# Patient Record
Sex: Female | Born: 1940 | Race: Black or African American | Hispanic: No | Smoking: Current every day smoker
Health system: Southern US, Community
[De-identification: ages and names within clinical notes are randomized; demographics above are authoritative.]

## PROBLEM LIST (undated history)

## (undated) DIAGNOSIS — E7800 Pure hypercholesterolemia, unspecified: Secondary | ICD-10-CM

## (undated) DIAGNOSIS — I10 Essential (primary) hypertension: Secondary | ICD-10-CM

## (undated) DIAGNOSIS — R252 Cramp and spasm: Secondary | ICD-10-CM

---

## 1997-06-10 ENCOUNTER — Emergency Department (HOSPITAL_COMMUNITY): Admission: EM | Admit: 1997-06-10 | Discharge: 1997-06-10 | Payer: Self-pay | Admitting: Emergency Medicine

## 1997-07-17 ENCOUNTER — Ambulatory Visit (HOSPITAL_COMMUNITY): Admission: RE | Admit: 1997-07-17 | Discharge: 1997-07-17 | Payer: Self-pay | Admitting: Cardiology

## 1997-10-19 ENCOUNTER — Emergency Department (HOSPITAL_COMMUNITY): Admission: EM | Admit: 1997-10-19 | Discharge: 1997-10-19 | Payer: Self-pay | Admitting: Emergency Medicine

## 1999-02-26 ENCOUNTER — Encounter: Payer: Self-pay | Admitting: Cardiology

## 1999-02-26 ENCOUNTER — Ambulatory Visit (HOSPITAL_COMMUNITY): Admission: RE | Admit: 1999-02-26 | Discharge: 1999-02-26 | Payer: Self-pay | Admitting: Cardiology

## 1999-06-04 ENCOUNTER — Inpatient Hospital Stay (HOSPITAL_COMMUNITY): Admission: EM | Admit: 1999-06-04 | Discharge: 1999-06-11 | Payer: Self-pay | Admitting: Emergency Medicine

## 1999-06-04 ENCOUNTER — Encounter: Payer: Self-pay | Admitting: Cardiology

## 1999-06-04 ENCOUNTER — Encounter: Payer: Self-pay | Admitting: Emergency Medicine

## 1999-06-05 ENCOUNTER — Encounter: Payer: Self-pay | Admitting: Cardiology

## 1999-06-14 ENCOUNTER — Emergency Department (HOSPITAL_COMMUNITY): Admission: EM | Admit: 1999-06-14 | Discharge: 1999-06-14 | Payer: Self-pay | Admitting: Emergency Medicine

## 1999-06-16 ENCOUNTER — Inpatient Hospital Stay (HOSPITAL_COMMUNITY): Admission: EM | Admit: 1999-06-16 | Discharge: 1999-06-18 | Payer: Self-pay

## 1999-06-16 ENCOUNTER — Encounter: Payer: Self-pay | Admitting: Cardiovascular Disease

## 1999-07-02 ENCOUNTER — Emergency Department (HOSPITAL_COMMUNITY): Admission: EM | Admit: 1999-07-02 | Discharge: 1999-07-02 | Payer: Self-pay | Admitting: Emergency Medicine

## 2000-01-27 ENCOUNTER — Emergency Department (HOSPITAL_COMMUNITY): Admission: EM | Admit: 2000-01-27 | Discharge: 2000-01-27 | Payer: Self-pay | Admitting: Emergency Medicine

## 2000-08-01 ENCOUNTER — Encounter: Payer: Self-pay | Admitting: Cardiology

## 2000-08-01 ENCOUNTER — Encounter: Admission: RE | Admit: 2000-08-01 | Discharge: 2000-08-01 | Payer: Self-pay | Admitting: Cardiology

## 2001-02-25 ENCOUNTER — Ambulatory Visit (HOSPITAL_COMMUNITY): Admission: RE | Admit: 2001-02-25 | Discharge: 2001-02-25 | Payer: Self-pay | Admitting: Cardiology

## 2002-01-17 ENCOUNTER — Encounter: Payer: Self-pay | Admitting: Emergency Medicine

## 2002-01-17 ENCOUNTER — Emergency Department (HOSPITAL_COMMUNITY): Admission: EM | Admit: 2002-01-17 | Discharge: 2002-01-17 | Payer: Self-pay | Admitting: Emergency Medicine

## 2002-07-26 ENCOUNTER — Emergency Department (HOSPITAL_COMMUNITY): Admission: EM | Admit: 2002-07-26 | Discharge: 2002-07-26 | Payer: Self-pay

## 2002-07-28 ENCOUNTER — Encounter: Payer: Self-pay | Admitting: Cardiology

## 2002-07-28 ENCOUNTER — Inpatient Hospital Stay (HOSPITAL_COMMUNITY): Admission: EM | Admit: 2002-07-28 | Discharge: 2002-07-31 | Payer: Self-pay | Admitting: Emergency Medicine

## 2003-09-05 ENCOUNTER — Emergency Department (HOSPITAL_COMMUNITY): Admission: EM | Admit: 2003-09-05 | Discharge: 2003-09-05 | Payer: Self-pay | Admitting: Emergency Medicine

## 2005-05-13 ENCOUNTER — Emergency Department (HOSPITAL_COMMUNITY): Admission: EM | Admit: 2005-05-13 | Discharge: 2005-05-13 | Payer: Self-pay | Admitting: Family Medicine

## 2005-08-08 ENCOUNTER — Emergency Department (HOSPITAL_COMMUNITY): Admission: EM | Admit: 2005-08-08 | Discharge: 2005-08-08 | Payer: Self-pay | Admitting: Family Medicine

## 2005-08-31 ENCOUNTER — Emergency Department (HOSPITAL_COMMUNITY): Admission: EM | Admit: 2005-08-31 | Discharge: 2005-08-31 | Payer: Self-pay | Admitting: Family Medicine

## 2006-01-19 ENCOUNTER — Emergency Department (HOSPITAL_COMMUNITY): Admission: EM | Admit: 2006-01-19 | Discharge: 2006-01-19 | Payer: Self-pay | Admitting: Family Medicine

## 2006-12-07 ENCOUNTER — Encounter: Admission: RE | Admit: 2006-12-07 | Discharge: 2006-12-07 | Payer: Self-pay | Admitting: Cardiology

## 2006-12-13 ENCOUNTER — Emergency Department (HOSPITAL_COMMUNITY): Admission: EM | Admit: 2006-12-13 | Discharge: 2006-12-13 | Payer: Self-pay | Admitting: Family Medicine

## 2007-01-18 ENCOUNTER — Emergency Department (HOSPITAL_COMMUNITY): Admission: EM | Admit: 2007-01-18 | Discharge: 2007-01-18 | Payer: Self-pay | Admitting: Family Medicine

## 2007-02-01 ENCOUNTER — Encounter: Admission: RE | Admit: 2007-02-01 | Discharge: 2007-02-01 | Payer: Self-pay | Admitting: Cardiology

## 2007-06-05 ENCOUNTER — Emergency Department (HOSPITAL_COMMUNITY): Admission: EM | Admit: 2007-06-05 | Discharge: 2007-06-06 | Payer: Self-pay | Admitting: Emergency Medicine

## 2007-06-12 ENCOUNTER — Emergency Department (HOSPITAL_COMMUNITY): Admission: EM | Admit: 2007-06-12 | Discharge: 2007-06-12 | Payer: Self-pay | Admitting: Family Medicine

## 2008-01-02 ENCOUNTER — Emergency Department (HOSPITAL_COMMUNITY): Admission: EM | Admit: 2008-01-02 | Discharge: 2008-01-02 | Payer: Self-pay | Admitting: Family Medicine

## 2008-02-16 ENCOUNTER — Emergency Department (HOSPITAL_COMMUNITY): Admission: EM | Admit: 2008-02-16 | Discharge: 2008-02-16 | Payer: Self-pay | Admitting: Emergency Medicine

## 2008-04-18 ENCOUNTER — Encounter: Admission: RE | Admit: 2008-04-18 | Discharge: 2008-04-18 | Payer: Self-pay | Admitting: Cardiology

## 2008-08-11 ENCOUNTER — Emergency Department (HOSPITAL_COMMUNITY): Admission: EM | Admit: 2008-08-11 | Discharge: 2008-08-11 | Payer: Self-pay | Admitting: Emergency Medicine

## 2008-11-14 ENCOUNTER — Encounter: Admission: RE | Admit: 2008-11-14 | Discharge: 2008-11-14 | Payer: Self-pay | Admitting: Cardiology

## 2009-05-31 ENCOUNTER — Encounter: Admission: RE | Admit: 2009-05-31 | Discharge: 2009-05-31 | Payer: Self-pay | Admitting: Cardiology

## 2009-06-21 IMAGING — CR DG CHEST 1V PORT
1 series · 1 of 1 positions shown · non-contrast
Comparison: None

CLINICAL DATA: S O B

PORTABLE CHEST - 1 VIEW

[AP]
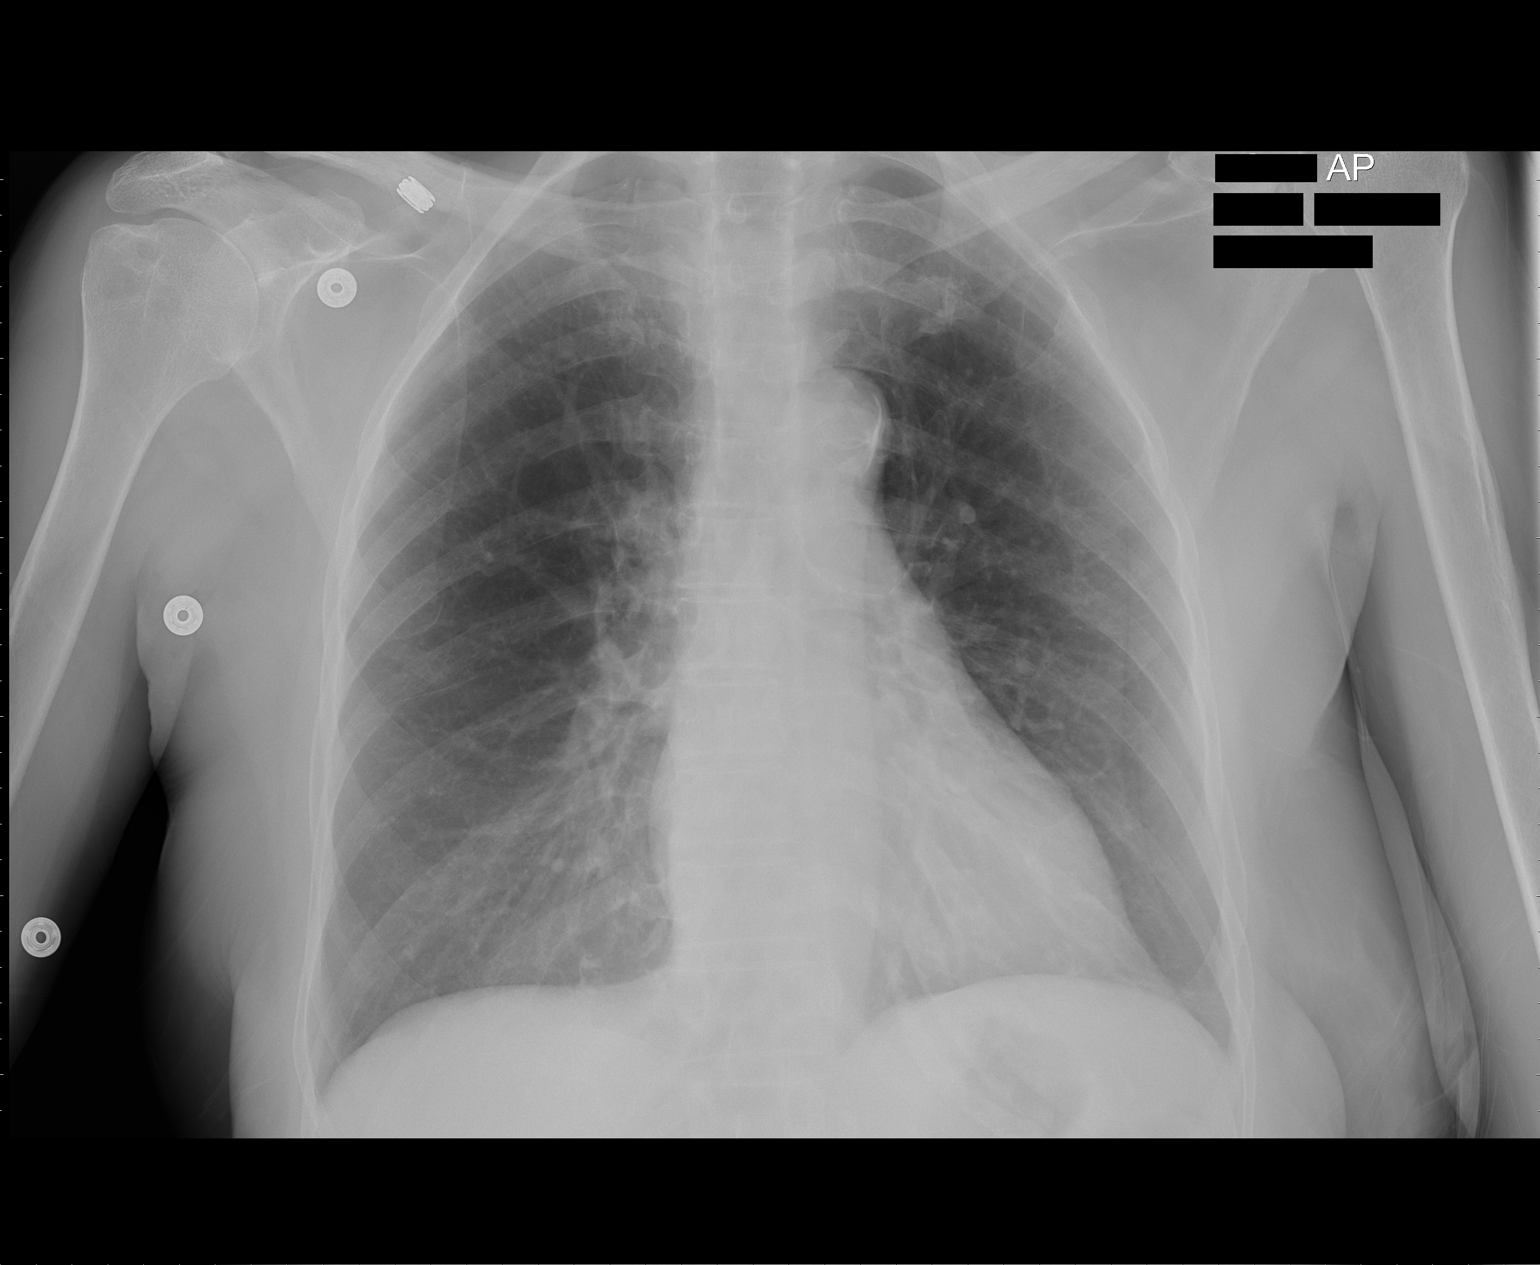

[1 of 1 positions shown; findings below may reference images not displayed]

FINDINGS: Heart size normal.

No pleural effusion or pulmonary edema

No airspace opacities are identified.

There is mildly coarsened interstitial lung markings.
IMPRESSION: 1.  No acute cardiopulmonary abnormalities.

## 2009-08-21 ENCOUNTER — Ambulatory Visit: Payer: Self-pay | Admitting: Surgery

## 2009-08-21 ENCOUNTER — Ambulatory Visit (HOSPITAL_COMMUNITY): Admission: RE | Admit: 2009-08-21 | Discharge: 2009-08-21 | Payer: Self-pay | Admitting: Cardiology

## 2009-08-21 ENCOUNTER — Encounter (INDEPENDENT_AMBULATORY_CARE_PROVIDER_SITE_OTHER): Payer: Self-pay | Admitting: Cardiology

## 2010-02-10 ENCOUNTER — Encounter: Payer: Self-pay | Admitting: Cardiology

## 2010-02-10 ENCOUNTER — Encounter: Payer: Self-pay | Admitting: Gastroenterology

## 2010-05-06 LAB — POCT I-STAT, CHEM 8
Calcium, Ion: 1.2 mmol/L (ref 1.12–1.32)
Creatinine, Ser: 0.8 mg/dL (ref 0.4–1.2)
Glucose, Bld: 187 mg/dL — ABNORMAL HIGH (ref 70–99)
HCT: 39 % (ref 36.0–46.0)
Hemoglobin: 13.3 g/dL (ref 12.0–15.0)
TCO2: 29 mmol/L (ref 0–100)

## 2010-06-07 NOTE — Discharge Summary (Signed)
[place]. [facility]  Patient:    Lisa Carpenter, Lisa Carpenter                    MRN: [id number] Adm. Date:  [id number] Disc. Date: [id number] Attending:  Ricki Rodriguez                           Discharge Summary  REFERRING PHYSICIAN:  Dr. Donia Guiles.  PRINCIPAL DIAGNOSES: 1. Diabetes mellitus type 2. 2. Nausea and vomiting with viral gastroenteritis. 3. Hypertension.  DISCHARGE MEDICATIONS: 1. Norvasc 5 mg 1 p.o. daily. 2. Humulin 70/30 15 units in the morning and 15 units in the evening    subcutaneously.  DIET:  Low fat, low salt diet as tolerated.  ACTIVITY:  As tolerated.  FOLLOW-UP:  Follow-up appointment by Dr. Donia Guiles in two weeks.  Patient to call the office.  SPECIAL INSTRUCTIONS:  Patient to check blood sugars daily.  CONDITION ON DISCHARGE:  Stable.  HISTORY OF PRESENT ILLNESS:  This 70 year old black female presented with elevated blood sugars and nausea and vomiting.  The patient had some abdominal discomfort.  Has a chronic history of pancreatitis and a history of diabetes for 18 years.  The patient recently quit smoking.  PHYSICAL EXAMINATION:  VITAL SIGNS:  Temperature 100, pulse 93, respirations 16, blood pressure 177/88.  Height 5 feet 7 inches, weight 138 pounds.  GENERAL:  Alert and oriented x 3.  HEENT:  Normocephalic, atraumatic.  Eyes:  Manson Passey, with pupils equally reacting to light.  Extraocular movements intact.  Conjunctivae pink, sclerae nonicteric.  Ears, nose, throat:  Mucous membranes pink and moist.  NECK:  No JVD.  No carotid bruit.  LUNGS:  Clear bilaterally.  HEART:  Normal S1, S2.  ABDOMEN:  Soft, with mild epigastric tenderness.  EXTREMITIES:  No edema, cyanosis, or clubbing.  CNS:  Cranial nerves II-XII grossly intact.  LABORATORY DATA:  Near normal hemoglobin and hematocrit, borderline WBC count, normal platelet count.  Sodium falsely low at 124, potassium 4.9, BUN 8, creatinine 1.1, glucose  elevated at 416.  Urinalysis negative for bacteria. Serum amylase and lipase were normal.  X-ray of the abdomen revealed mild degenerative changes of the spine and normal bowel gas pattern with the possibility of chronic pancreatitis.  HOSPITAL COURSE:  The patient was admitted to [address], started on IV fluids, and she had Humulin 70/30 a.m. dose plus Regular Insulin coverage. Her x-ray of the abdomen did not show any obstruction; however, her elevated blood glucoses were controlled with clonidine and Norvasc use.  Subsequently, with low blood pressure, clonidine was withdrawn.  Her abdominal discomfort disappeared.  She did not have any vomiting or diarrhea, and her blood sugars were controlled nicely with restarting insulin usual doses, and she was discharged home in satisfactory condition, with follow-up by me in two weeks or by Dr. Donia Guiles in two weeks. DD:  06/18/99 TD:  06/18/99 Job: [id number]

## 2010-06-07 NOTE — H&P (Signed)
[place]. [facility]  Patient:    Lisa Carpenter, Lisa Carpenter                      MRN: [id number] Attending:  Sabino Gasser, M.D. CC:         Osvaldo Shipper. Spruill, M.D.             Anselmo Rod, M.D.                         History and Physical  HISTORY OF PRESENT ILLNESS:  Lisa Carpenter is a 70 year old black female who was admitted with abdominal pain in the right upper quadrant associated with vomiting and a loose stool.  The patient states that she was well until this past Monday, at which point she had eaten dinner, went to bed, and awoke about 1 a.m., at which point she was awakened with severe right upper quadrant pain that doubled her over.  Subsequently, she developed vomiting of clear material.  There was no food vomited.  She had eaten approximately eight hours before, [place], greens, and a full meal.  She states that the pain lasted throughout the night but dissipated and went away as she got to the hospital. Vomiting persisted, but it too has now abated, and the last time she vomited was yesterday.  She did feel nauseated since then but subsequently took broth and clear liquid diet for breakfast and lunch but then at a salad at supper with steak and other things on the menu and tolerated those well without vomiting or abdominal pain recurring.  The patient states that she has had two or three episodes similar to this in the past, which sound like she has been told they might be chronic pancreatitis.  She associates these with the passage of oily, foul-smelling stools, which she had one of which a couple of days ago.  She denies fever, chills, night sweats, hematemesis, dysphagia, weight loss, change in bowel habits, blood in her stools.  She said that she sounds like she had colonoscopy and possibly an endoscopy done by Dr. Loreta Ave, which she said was in the recent past, for evaluation of his, and she, it sounds, received pancreatic supplements to be taken at  supper time, but this did not seem to help her situation according to her.  She says her stools have been light in color recently, as they typically are.  She denies black stools.  PAST MEDICAL HISTORY:  Also notable for chronic back problems and diabetes since 1983, recent onset of hypertension, and she has a disability because of her medical problems.  SOCIAL HISTORY:  She smokes a couple of cigarettes a day.  Denies alcohol at this time, although she does admit to drinking in the past.  FAMILY HISTORY:  Negative for colon polyps, colon cancers, or other GI disorders.  She had one son, who is now deceased.  ALLERGIES:  She denies allergies to medications.  PHYSICAL EXAMINATION:  GENERAL:  She is an alert female, who appears in no distress, oriented to time, place, and person.  HEENT:  Unremarkable.  NECK:  Thyroid not enlarged.  No bruits are heard in the neck.  LUNGS:  Clear.  HEART:  Regular rhythm without murmurs, gallops, rubs.  ABDOMEN:  Benign, nontender, bowel sounds are active.  RECTAL:  Deferred.  LABORATORY DATA:  Hemoglobin A1C is 9.6.  Lipid profile shows triglycerides of 64.  Amylase is 27.  Electrolytes  unremarkable.  BUN is now 11, creatinine 0.9, calcium 9.1.  Her osmolality was 305, and she was somewhat dehydrated on admission with liver function tests showing a total bilirubin of 1.6, but otherwise liver function tests were unremarkable.  CBC on admission showed a normal white count.  Radiology studies showed an abdominal ultrasound showing the liver to be unremarkable, no abnormality of the gallbladder, bile duct, head of pancreas well seen and unremarkable.  Tail is not well seen of the pancreas.  IMPRESSION:  Recurrence of chronic pancreatitis, resolving or almost totally resolved at this point.  Wound continue diet as tolerated and follow.  If she tolerates diet, could probably be discharged home soon. DD:  06/06/99 TD:  06/07/99 Job:  [id number]

## 2010-06-07 NOTE — Discharge Summary (Signed)
NAME:  Lisa Carpenter, Lisa Carpenter                       ACCOUNT NO.:  [id number]   MEDICAL RECORD NO.:  [id number]                   PATIENT TYPE:  INP   LOCATION:  0372                                 FACILITY:  [facility]   PHYSICIAN:  Mohan N. Sharyn Lull, M.D.              DATE OF BIRTH:  08-Nov-1940   DATE OF ADMISSION:  07/28/2002  DATE OF DISCHARGE:  07/31/2002                                 DISCHARGE SUMMARY   ADMISSION DIAGNOSES:  1. Uncontrolled diabetes mellitus.  2. Abdominal pain rule out  pancreatitis.   FINAL DIAGNOSES:  1. Status post uncontrolled diabetes mellitus with gastroparesis.  2. Hypertension.  3. Status post abdominal pain rule out gastroesophageal reflux disease, rule     out  peptic ulcer disease.   DISCHARGE MEDICATIONS:  1. Toprol XL 50 mg 1 tablet daily.  2. Glucophage SR 400 mg 1 tablet daily with supper.  3. Avandia 2 mg 1 tablet daily in the morning.  4. Lantus insulin 10 units SQ daily at night.  5. Protonix 40 mg 1 tablet daily.  6. Reglan 10 mg 1 tablet before meals three times a day.   ACTIVITY:  As tolerated.   DIET:  Low-salt, low-cholesterol, 1800 calorie ADA diet.   SPECIAL INSTRUCTIONS:  The patient has been advised to monitor her blood  sugar twice daily and chart.   FOLLOW UP:  With Dr. Algie Coffer in 1 week and Dr. Shana Chute in 2 weeks.   CONDITION ON DISCHARGE:  Stable.   HISTORY OF PRESENT ILLNESS:  Lisa Carpenter is a 70 year old black female with  past medical history significant for insulin requiring diabetes mellitus,  hypertension, history of pancreatitis, history of tobacco abuse and alcohol  abuse. Questionable history of hypercholesterolemia. Was admitted by Dr.  Algie Coffer on July 28, 2002 because of abdominal pain, nausea, and feeling weak  for 2 days.   PAST MEDICAL HISTORY:  As above.   HOME MEDICATIONS:  She takes Glucophage, Avandia and Lantus insulin.   ALLERGIES:  None.   SOCIAL HISTORY:  She is divorced, has 1 son. One son died  of renal failure  at the age of 42. She is retired. __________ in the past.   FAMILY HISTORY:  Her father died of MI in his 63's. Has 2 brothers and 3  sisters. Mother is alive. She is 82.   PHYSICAL EXAMINATION:  GENERAL: She was alert, awake, oriented x3. No acute  distress.  HEENT: Conjunctiva was pink.  NECK: No JVD.  LUNGS: Clear.  HEART: Sounds S1 and S2 are normal.  EXTREMITIES: There is no clubbing, cyanosis, or edema.  NEUROLOGIC: Grossly intact.   LABORATORY DATA:  Urinalysis was positive for ketones and glucose. Her  sodium was 131, potassium was 4.3, blood sugar was 512, chloride 96, bicarb  24, BUN 21, creatinine 1.2. Her liver enzymes are normal. Hemoglobin A1C was  11.4. Hemoglobin was 12.9. Hematocrit 38.8. White count 6.8.  Repeat blood  sugar on July 29, 2002 was 2.16. Her amylase was 25, lipase was 10. Her blood  sugar this morning 75. CBG is 91. BUN is 10. Creatinine 0.9. Potassium is  4.1.  Her KUB was negative.   HOSPITAL COURSE:  The patient was admitted to telemetry. Was started on IV  fluids and IV insulin with good control of blood sugar. The patient's blood  sugar is fairly well controlled. The patient did not have any further  episodes of abdominal pain, nausea, vomiting or diarrhea. The patient is  ambulating in the hallway without any problems. Her appetite has improved  since admission.   DISPOSITION:  The patient will be discharged home on above medications and  will be followed by Dr. Algie Coffer in 1 week and Dr. Shana Chute in 2 weeks.                                               Eduardo Osier. Sharyn Lull, M.D.    MNH/MEDQ  D:  07/31/2002  T:  07/31/2002  Job:  [id number]   cc:   Ricki Rodriguez, M.D.  [address]  [place] [address]   Osvaldo Shipper. Spruill, M.D.  [address] [address]  [place]  [place] [address]  Fax: [phone]

## 2010-06-07 NOTE — Discharge Summary (Signed)
[place] [facility]  Patient:    Lisa Carpenter, Lisa Carpenter                    MRN: [id number] Adm. Date:  [id number] Disc. Date: [id number] Attending:  Doug Sou Dictator:   Lyla Son. Achilles Dunk.                           Discharge Summary  DISCHARGE DIAGNOSES: 1. Chronic pancreatitis. 2. Insulin-dependent diabetes mellitus with neuropathy. 3. Diabetic ketoacidosis. 4. Gastroparesis.  HISTORY OF PRESENT ILLNESS AND HOSPITAL COURSE:  This is a 69 year old patient who was admitted to the [facility] on Jun 04, 1999, after having a two-day complaint of nausea, vomiting, poor appetite.  She was evaluated in the emergency department.  There she was noted to have an electrocardiogram that showed no acute changes, a chest x-ray that was within normal limits, a complete blood count that was within normal limits.  The chemistries, however, showed a ______ of 133, a CO2 of 23, ______ of 369.  Serum acetone was small.  At this point the patient was admitted for diabetic ketoacidosis.  The patient was started on aggressive IV fluid therapy.  The patient was treated with sliding scale insulin.  Treated with Altace for her elevated blood pressure of 197/114.  The patient was also noted to have diabetic gastroparesis.  Consultation was obtained from GI medicine, Dr. Elsie Amis.  It was the GI opinion that the patient had recurrent episode of chronic pancreatitis.  On Jun 07, 1999, the patient was seen by the diabetic coordinator, and patient education on her diabetic issues was carried out.  On Jun 10, 1999, the glucose was elevated at 503.  The patients insulins were adjusted.  The patient responded nicely.  On Jun 11, 1999, it was the opinion that the patient was now able to keep liquids and keep foods down.  She was feeling some better.  The discomfort was improved.  The glucose was under better control, and it was the opinion that she could be discharged  home, having received maximum benefit from this hospitalization.  DISCHARGE MEDICATIONS: 1. Humulin 70/30 insulin 15 units q.a.m., 15 units q.p.m. 2. Reglan 10 mg 2 tablets 1/2 hour before meals and at bedtime. 3. Phenergan tablets 50 mg every six hours as needed.  DISCHARGE INSTRUCTIONS:  The patient is advised to monitor her sugar carefully and keep a record.  She is to notify the physician if sugars are greater than 350 or less than 80.  FOLLOW-UP:  She is to be seen in the office in two weeks for general follow-up and sooner if any changes, problems, or concerns. DD:  07/16/99 TD:  07/16/99 Job: [id number]

## 2010-08-13 ENCOUNTER — Ambulatory Visit (INDEPENDENT_AMBULATORY_CARE_PROVIDER_SITE_OTHER): Payer: Medicare HMO

## 2010-08-13 ENCOUNTER — Inpatient Hospital Stay (INDEPENDENT_AMBULATORY_CARE_PROVIDER_SITE_OTHER)
Admission: RE | Admit: 2010-08-13 | Discharge: 2010-08-13 | Disposition: A | Discharge status: Home / Self Care | Payer: Medicare HMO | Source: Ambulatory Visit | Attending: Emergency Medicine | Admitting: Emergency Medicine

## 2010-08-13 DIAGNOSIS — S5000XA Contusion of unspecified elbow, initial encounter: Secondary | ICD-10-CM

## 2010-08-13 DIAGNOSIS — I10 Essential (primary) hypertension: Secondary | ICD-10-CM

## 2010-10-16 LAB — GLUCOSE, RANDOM: Glucose, Bld: 92

## 2010-10-16 LAB — POCT I-STAT, CHEM 8
Calcium, Ion: 1.1 — ABNORMAL LOW
Creatinine, Ser: 1.1
Hemoglobin: 13.3
Sodium: 124 — ABNORMAL LOW
TCO2: 24

## 2010-10-16 LAB — POTASSIUM: Potassium: 3.5

## 2010-10-16 LAB — URINALYSIS, ROUTINE W REFLEX MICROSCOPIC

## 2010-10-16 LAB — URINE MICROSCOPIC-ADD ON

## 2010-10-25 LAB — CBC
HCT: 35.9 % — ABNORMAL LOW (ref 36.0–46.0)
MCHC: 33.4 g/dL (ref 30.0–36.0)
Platelets: 162 10*3/uL (ref 150–400)
RDW: 13.2 % (ref 11.5–15.5)

## 2010-10-25 LAB — DIFFERENTIAL
Basophils Relative: 1 % (ref 0–1)
Lymphocytes Relative: 32 % (ref 12–46)
Lymphs Abs: 1.3 10*3/uL (ref 0.7–4.0)
Monocytes Absolute: 0.5 10*3/uL (ref 0.1–1.0)
Monocytes Relative: 12 % (ref 3–12)
Neutro Abs: 2.2 10*3/uL (ref 1.7–7.7)

## 2010-10-25 LAB — COMPREHENSIVE METABOLIC PANEL
Albumin: 3.5 g/dL (ref 3.5–5.2)
Alkaline Phosphatase: 89 U/L (ref 39–117)
BUN: 8 mg/dL (ref 6–23)
Calcium: 8.4 mg/dL (ref 8.4–10.5)
Potassium: 4.3 mEq/L (ref 3.5–5.1)
Total Protein: 6.2 g/dL (ref 6.0–8.3)

## 2011-03-03 ENCOUNTER — Encounter (HOSPITAL_COMMUNITY): Payer: Self-pay | Admitting: *Deleted

## 2011-03-03 ENCOUNTER — Emergency Department (INDEPENDENT_AMBULATORY_CARE_PROVIDER_SITE_OTHER)
Admission: EM | Admit: 2011-03-03 | Discharge: 2011-03-03 | Disposition: A | Discharge status: Home / Self Care | Payer: Medicare Other | Source: Home / Self Care | Attending: Emergency Medicine | Admitting: Emergency Medicine

## 2011-03-03 DIAGNOSIS — B029 Zoster without complications: Secondary | ICD-10-CM

## 2011-03-03 HISTORY — DX: Essential (primary) hypertension: I10

## 2011-03-03 HISTORY — DX: Pure hypercholesterolemia, unspecified: E78.00

## 2011-03-03 HISTORY — DX: Cramp and spasm: R25.2

## 2011-03-03 MED ORDER — TRAMADOL HCL 50 MG PO TABS: 100.0000 mg | ORAL_TABLET | Freq: Three times a day (TID) | ORAL | Status: AC | PRN

## 2011-03-03 MED ORDER — ACYCLOVIR 400 MG PO TABS: 800.0000 mg | ORAL_TABLET | ORAL | Status: AC

## 2011-03-03 NOTE — ED Provider Notes (Signed)
Chief Complaint  Patient presents with  . Neck Pain    History of Present Illness:  The patient has had a three-day history of pain in the left trapezius ridge, radiating to the left ear in the entire left side of the face. It feels like a throbbing pain and is worse with movement of the neck. She denies any radiation the pain down the arm, numbness, tingling, weakness in the arm. There is no pain on movement of the shoulder. She denies any headache, recent changes in her vision, or jaw claudication. She denies any facial paresthesias, weakness, difficulty with speech, or any other neurological symptoms. She is a diabetic which is currently controlled with medication.  Review of Systems:  Other than noted above, the patient denies any of the following symptoms: Constitutional:  No fever, chills, or sweats. ENT:  No nasal congestion, sore throat, pain on swallowing, or oral ulcerations or lesions.  No hoarseness or change in voice. Neck:  No swelling, or adenopathy.  Full ROM without pain. Cardiac:  No chest pain, tightness, pressure, or syncope. Respiratory:  No cough, wheezing, or dyspnea. M-S:  No joint pain, muscle pain, or back problems. Neuro:  No muscle weakness, numbness or paresthesias. Skin:  No rash.  PMFSH:  Past medical history, family history, social history, meds, and allergies were reviewed.  Physical Exam:   Vital signs:  BP 169/75  Pulse 83  Temp(Src) 97.2 F (36.2 C) (Oral)  Resp 20  SpO2 98% General:  Alert, oriented and in no distress. Eye:  PERRL, full EOMs. ENT:  Pharynx clear, no oral lesions. Neck:  There is pain to palpation over the trapezius ridge, but no pain to palpation over the midline neck or over the posterior neck. There is also tenderness to palpation over the anterior neck in the anterior cervical triangle, but no mass or adenopathy was felt. The neck has limited range of motion with pain on movement. Lungs:  No respiratory distress.  Breath sounds  clear and equal bilaterally.  No wheezes, rales or rhonchi. Heart:  Regular rhythm.  No gallops, murmers, or rubs. Ext:  No upper extremity edema, pulses full.  Full ROM of joints with no joint or muscle pain to palpation. Neuro:  Alert and oriented times 3.  No focal muscle weakness.  DTRs symmetric.  Sensation intact to light touch. Skin: Clear, warm and dry.  No rash.  Good capillary refill.  Labs:  A sedimentation rate was drawn.   Assessment:   Diagnoses that have been ruled out:  None  Diagnoses that are still under consideration:  Cervical strain, temporal arteritis, osteoarthritis of neck, or cervical radiculopathy or neuritis   None  Final diagnoses:  Shingles    Plan:   1.  The following meds were prescribed:   New Prescriptions   ACYCLOVIR (ZOVIRAX) 400 MG TABLET    Take 2 tablets (800 mg total) by mouth every 4 (four) hours while awake.   TRAMADOL (ULTRAM) 50 MG TABLET    Take 2 tablets (100 mg total) by mouth every 8 (eight) hours as needed for pain.   2.  The patient was instructed in symptomatic care and handouts were given. 3.  The patient was told to return if becoming worse in any way, if no better in 3 or 4 days, and given some red flag symptoms that would indicate earlier return. I told the patient to followup with her primary care physician, Dr. Sharyn Lull in 2 days.    Onalee Hua  Matthew Folks, MD 03/03/11 [phone]

## 2011-03-03 NOTE — ED Notes (Signed)
Pt is here with complaints of 2 day history of left sided neck pain and stiffness.  Describes pain as sore muscles.  Pt states she woke up with pain one morning, no known injury.

## 2011-03-04 ENCOUNTER — Telehealth (HOSPITAL_COMMUNITY): Payer: Self-pay | Admitting: *Deleted

## 2011-03-04 NOTE — ED Notes (Signed)
1725  Sed Rate 40 H. Lab shown to Dr. Lorenz Coaster.  He said to call pt. and tell her is was a " little bit high- not serious- no further treatment needed-f/u with your PCP." I called pt. and verified her DOB. Pt. given result and doctors instructions. Vassie Moselle 03/04/2011

## 2011-05-27 ENCOUNTER — Emergency Department (HOSPITAL_COMMUNITY)
Admission: EM | Admit: 2011-05-27 | Discharge: 2011-05-27 | Disposition: A | Discharge status: Home / Self Care | Payer: Medicare Other | Attending: Emergency Medicine | Admitting: Emergency Medicine

## 2011-05-27 ENCOUNTER — Encounter (HOSPITAL_COMMUNITY): Payer: Self-pay | Admitting: *Deleted

## 2011-05-27 DIAGNOSIS — M7989 Other specified soft tissue disorders: Secondary | ICD-10-CM

## 2011-05-27 DIAGNOSIS — F17200 Nicotine dependence, unspecified, uncomplicated: Secondary | ICD-10-CM | POA: Insufficient documentation

## 2011-05-27 DIAGNOSIS — M79662 Pain in left lower leg: Secondary | ICD-10-CM

## 2011-05-27 DIAGNOSIS — R209 Unspecified disturbances of skin sensation: Secondary | ICD-10-CM | POA: Insufficient documentation

## 2011-05-27 DIAGNOSIS — I10 Essential (primary) hypertension: Secondary | ICD-10-CM | POA: Insufficient documentation

## 2011-05-27 DIAGNOSIS — M79609 Pain in unspecified limb: Secondary | ICD-10-CM

## 2011-05-27 DIAGNOSIS — E119 Type 2 diabetes mellitus without complications: Secondary | ICD-10-CM | POA: Insufficient documentation

## 2011-05-27 DIAGNOSIS — Z794 Long term (current) use of insulin: Secondary | ICD-10-CM | POA: Insufficient documentation

## 2011-05-27 MED ORDER — OXYCODONE-ACETAMINOPHEN 5-325 MG PO TABS
1.0000 | ORAL_TABLET | Freq: Once | ORAL | Status: AC
  Administered 2011-05-27: 1 via ORAL
  Filled 2011-05-27 (×2): qty 1

## 2011-05-27 MED ORDER — OXYCODONE-ACETAMINOPHEN 5-325 MG PO TABS: 1.0000 | ORAL_TABLET | ORAL | Status: AC | PRN

## 2011-05-27 NOTE — ED Notes (Signed)
Pt is here with swelling to left leg and was put on coumadin by dr.harwani just incase she has a blood clot.  Pt is having pain in calf and has some redness.  Pulse palpable

## 2011-05-27 NOTE — ED Provider Notes (Signed)
History  This chart was scribed for Lisa Booze, MD by Bennett Scrape. This patient was seen in room STRE8/STRE8 and the patient's care was started at 12:49PM.  CSN: [id number]  Arrival date & time 05/27/11  1026   First MD Initiated Contact with Patient 05/27/11 1249      Chief Complaint  Patient presents with  . Leg Swelling    left     The history is provided by the patient. No language interpreter was used.    Lisa Carpenter is a 71 y.o. female who presents to the Emergency Department complaining of one month of gradual onset, gradually worsening, constant pain in left calf described as sharp with shooting pains into her left toes with associated numbness. She is ambulatory but states that the pain is worse with walking. She states that the pain is worse at night because it starts cramping. She states she was given ASA and pain medications by PCP with no improvement in symptoms. Pt also reports that she has been using Aleve to treat symptoms. She states that she was put on plavix by her PCP just in case she has a blood clot. She denies pain in the left leg, nausea and SOB. She has a h/o HTN and diabetes.  Dr. Sharyn Lull is PCP.  Past Medical History  Diagnosis Date  . Hypertension   . Diabetes mellitus   . High cholesterol   . Muscle cramps at night     History reviewed. No pertinent past surgical history.  No family history on file.  History  Substance Use Topics  . Smoking status: Current Everyday Smoker- one cigarette a day  . Smokeless tobacco: Not on file  . Alcohol Use: No     Review of Systems  Constitutional: Negative for fever and chills.  Respiratory: Negative for shortness of breath.   Cardiovascular: Positive for leg swelling. Negative for chest pain.  Gastrointestinal: Negative for nausea and vomiting.  Neurological: Negative for weakness.    Allergies  Review of patient's allergies indicates no known allergies.  Home Medications   Current  Outpatient Rx  Name Route Sig Dispense Refill  . AMLODIPINE BESYLATE 5 MG PO TABS Oral Take 5 mg by mouth daily.    . ASPIRIN EC 81 MG PO TBEC Oral Take 81 mg by mouth daily.    Marland Kitchen CLOPIDOGREL BISULFATE 75 MG PO TABS Oral Take 75 mg by mouth daily.    Marland Kitchen EZETIMIBE-SIMVASTATIN 10-20 MG PO TABS Oral Take 1 tablet by mouth at bedtime.    . INSULIN GLARGINE 100 UNIT/ML [place] SOLN Subcutaneous Inject 20 Units into the skin daily.     . TRAMADOL HCL 50 MG PO TABS Oral Take 50 mg by mouth 3 (three) times daily.      Triage Vitals: BP 187/68  Pulse 79  Temp(Src) 98 F (36.7 C) (Oral)  Resp 18  SpO2 96%  Physical Exam  Nursing note and vitals reviewed. Constitutional: She is oriented to person, place, and time. She appears well-developed and well-nourished. No distress.  HENT:  Head: Normocephalic and atraumatic.  Eyes: EOM are normal.  Neck: Neck supple. No tracheal deviation present.  Cardiovascular: Normal rate.   Pulmonary/Chest: Effort normal. No respiratory distress.  Musculoskeletal: Normal range of motion. She exhibits tenderness.       Calf circumference is equal, moderate tenderness to palpation of the right calf, no cord, no erythema, no warmth, negative holman's sign, strong dorsalis pedis pulses, prompt capillary refill  Neurological: She  is alert and oriented to person, place, and time.  Skin: Skin is warm and dry.  Psychiatric: She has a normal mood and affect. Her behavior is normal.    ED Course  Procedures (including critical care time)  DIAGNOSTIC STUDIES: Oxygen Saturation is 96% on room air, adequate by my interpretation.    COORDINATION OF CARE: 1:07PM-Advised pt to not use Aleve while on blood thinners due to bleeding complications. Discussed blood work, pain medications and [place] with pt and pt agreed to plan. 2:50PM-Discussed negative [place] with pt and pt acknowledged negative results. Discussed percocet with pt and pt agreed. Advised pt to follow up with PCP for further  evaluation.  Venous Doppler is reported to be negative for DVT. She'll be given a prescription for Percocet for pain and referred back to her PCP.  1. Pain of left calf       MDM  Calf pain of uncertain cause. No clinical sign of DVT but will get venous Doppler.  I personally performed the services described in this documentation, which was scribed in my presence. The recorded information has been reviewed and considered.         Lisa Booze, MD 05/30/11 [phone]

## 2011-05-27 NOTE — Progress Notes (Signed)
Bilateral lower extremity venous duplex completed.  Preliminary report is negative for DVT, SVT, or a Baker's cyst. 

## 2011-05-27 NOTE — Discharge Instructions (Signed)
Your Doppler test did not show any signs of blood clots. Take the pain medication as needed. Make a followup appointment with your doctor.   Pain of Unknown Etiology (Pain Without a Known Cause) You have come to your caregiver because of pain. Pain can occur in any part of the body. Often there is not a definite cause. If your laboratory (blood or urine) work was normal and x-rays or other studies were normal, your caregiver may treat you without knowing the cause of the pain. An example of this is the headache. Most headaches are diagnosed by taking a history. This means your caregiver asks you questions about your headaches. Your caregiver determines a treatment based on your answers. Usually testing done for headaches is normal. Often testing is not done unless there is no response to medications. Regardless of where your pain is located today, you can be given medications to make you comfortable. If no physical cause of pain can be found, most cases of pain will gradually leave as suddenly as they came.  If you have a painful condition and no reason can be found for the pain, It is importantthat you follow up with your caregiver. If the pain becomes worse or does not go away, it may be necessary to repeat tests and look further for a possible cause.  Only take over-the-counter or prescription medicines for pain, discomfort, or fever as directed by your caregiver.   For the protection of your privacy, test results can not be given over the phone. Make sure you receive the results of your test. Ask as to how these results are to be obtained if you have not been informed. It is your responsibility to obtain your test results.   You may continue all activities unless the activities cause more pain. When the pain lessens, it is important to gradually resume normal activities. Resume activities by beginning slowly and gradually increasing the intensity and duration of the activities or exercise. During  periods of severe pain, bed-rest may be helpful. Lay or sit in any position that is comfortable.   Ice used for acute (sudden) conditions may be effective. Use a large plastic bag filled with ice and wrapped in a towel. This may provide pain relief.   See your caregiver for continued problems. They can help or refer you for exercises or physical therapy if necessary.  If you were given medications for your condition, do not drive, operate machinery or power tools, or sign legal documents for 24 hours. Do not drink alcohol, take sleeping pills, or take other medications that may interfere with treatment. See your caregiver immediately if you have pain that is becoming worse and not relieved by medications. Document Released: 10/01/2000 Document Revised: 12/26/2010 Document Reviewed: 01/06/2005 [facility] Patient Information 2012 [place], [place].  Acetaminophen; Oxycodone tablets What is this medicine? ACETAMINOPHEN; OXYCODONE (a set a MEE noe fen; ox i KOE done) is a pain reliever. It is used to treat mild to moderate pain. This medicine may be used for other purposes; ask your health care provider or pharmacist if you have questions. What should I tell my health care provider before I take this medicine? They need to know if you have any of these conditions: -brain tumor -Crohn's disease, inflammatory bowel disease, or ulcerative colitis -drink more than 3 alcohol containing drinks per day -drug abuse or addiction -head injury -heart or circulation problems -kidney disease or problems going to the bathroom -liver disease -lung disease, asthma, or breathing problems -  an unusual or allergic reaction to acetaminophen, oxycodone, other opioid analgesics, other medicines, foods, dyes, or preservatives -pregnant or trying to get pregnant -breast-feeding How should I use this medicine? Take this medicine by mouth with a full glass of water. Follow the directions on the prescription label. Take  your medicine at regular intervals. Do not take your medicine more often than directed. Talk to your pediatrician regarding the use of this medicine in children. Special care may be needed. Patients over 19 years old may have a stronger reaction and need a smaller dose. Overdosage: If you think you have taken too much of this medicine contact a poison control center or emergency room at once. NOTE: This medicine is only for you. Do not share this medicine with others. What if I miss a dose? If you miss a dose, take it as soon as you can. If it is almost time for your next dose, take only that dose. Do not take double or extra doses. What may interact with this medicine? -alcohol or medicines that contain alcohol -antihistamines -barbiturates like amobarbital, butalbital, butabarbital, methohexital, pentobarbital, phenobarbital, thiopental, and secobarbital -benztropine -drugs for bladder problems like solifenacin, trospium, oxybutynin, tolterodine, hyoscyamine, and methscopolamine -drugs for breathing problems like ipratropium and tiotropium -drugs for certain stomach or intestine problems like propantheline, homatropine methylbromide, glycopyrrolate, atropine, belladonna, and dicyclomine -general anesthetics like etomidate, ketamine, nitrous oxide, propofol, desflurane, enflurane, halothane, isoflurane, and sevoflurane -medicines for depression, anxiety, or psychotic disturbances -medicines for pain like codeine, morphine, pentazocine, buprenorphine, butorphanol, nalbuphine, tramadol, and propoxyphene -medicines for sleep -muscle relaxants -naltrexone -phenothiazines like perphenazine, thioridazine, chlorpromazine, mesoridazine, fluphenazine, prochlorperazine, promazine, and trifluoperazine -scopolamine -trihexyphenidyl This list may not describe all possible interactions. Give your health care provider a list of all the medicines, herbs, non-prescription drugs, or dietary supplements you  use. Also tell them if you smoke, drink alcohol, or use illegal drugs. Some items may interact with your medicine. What should I watch for while using this medicine? Tell your doctor or health care professional if your pain does not go away, if it gets worse, or if you have new or a different type of pain. You may develop tolerance to the medicine. Tolerance means that you will need a higher dose of the medication for pain relief. Tolerance is normal and is expected if you take this medicine for a long time. Do not suddenly stop taking your medicine because you may develop a severe reaction. Your body becomes used to the medicine. This does NOT mean you are addicted. Addiction is a behavior related to getting and using a drug for a nonmedical reason. If you have pain, you have a medical reason to take pain medicine. Your doctor will tell you how much medicine to take. If your doctor wants you to stop the medicine, the dose will be slowly lowered over time to avoid any side effects. You may get drowsy or dizzy. Do not drive, use machinery, or do anything that needs mental alertness until you know how this medicine affects you. Do not stand or sit up quickly, especially if you are an older patient. This reduces the risk of dizzy or fainting spells. Alcohol may interfere with the effect of this medicine. Avoid alcoholic drinks. The medicine will cause constipation. Try to have a bowel movement at least every 2 to 3 days. If you do not have a bowel movement for 3 days, call your doctor or health care professional. Do not take Tylenol (acetaminophen) or medicines that have  acetaminophen with this medicine. Too much acetaminophen can be very dangerous. Many nonprescription medicines contain acetaminophen. Always read the labels carefully to avoid taking more acetaminophen. What side effects may I notice from receiving this medicine? Side effects that you should report to your doctor or health care professional as  soon as possible: -allergic reactions like skin rash, itching or hives, swelling of the face, lips, or tongue -breathing difficulties, wheezing -confusion -light headedness or fainting spells -severe stomach pain -yellowing of the skin or the whites of the eyes Side effects that usually do not require medical attention (report to your doctor or health care professional if they continue or are bothersome): -dizziness -drowsiness -nausea -vomiting This list may not describe all possible side effects. Call your doctor for medical advice about side effects. You may report side effects to FDA at 1-800-FDA-1088. Where should I keep my medicine? Keep out of the reach of children. This medicine can be abused. Keep your medicine in a safe place to protect it from theft. Do not share this medicine with anyone. Selling or giving away this medicine is dangerous and against the law. Store at room temperature between 20 and 25 degrees C (68 and 77 degrees F). Keep container tightly closed. Protect from light. Flush any unused medicines down the toilet. Do not use the medicine after the expiration date. NOTE: This sheet is a summary. It may not cover all possible information. If you have questions about this medicine, talk to your doctor, pharmacist, or health care provider.  2012, [employer]. (12/06/2007 10:01:21 AM)

## 2011-11-26 ENCOUNTER — Emergency Department (HOSPITAL_COMMUNITY)
Admission: EM | Admit: 2011-11-26 | Discharge: 2011-11-26 | Disposition: A | Discharge status: Home / Self Care | Payer: Medicare Other | Attending: Emergency Medicine | Admitting: Emergency Medicine

## 2011-11-26 ENCOUNTER — Emergency Department (HOSPITAL_COMMUNITY): Payer: Medicare Other

## 2011-11-26 DIAGNOSIS — R739 Hyperglycemia, unspecified: Secondary | ICD-10-CM

## 2011-11-26 DIAGNOSIS — Z794 Long term (current) use of insulin: Secondary | ICD-10-CM | POA: Insufficient documentation

## 2011-11-26 DIAGNOSIS — F17200 Nicotine dependence, unspecified, uncomplicated: Secondary | ICD-10-CM | POA: Insufficient documentation

## 2011-11-26 DIAGNOSIS — E1169 Type 2 diabetes mellitus with other specified complication: Secondary | ICD-10-CM | POA: Insufficient documentation

## 2011-11-26 DIAGNOSIS — Z7982 Long term (current) use of aspirin: Secondary | ICD-10-CM | POA: Insufficient documentation

## 2011-11-26 DIAGNOSIS — Z79899 Other long term (current) drug therapy: Secondary | ICD-10-CM | POA: Insufficient documentation

## 2011-11-26 DIAGNOSIS — I10 Essential (primary) hypertension: Secondary | ICD-10-CM | POA: Insufficient documentation

## 2011-11-26 DIAGNOSIS — E7800 Pure hypercholesterolemia, unspecified: Secondary | ICD-10-CM | POA: Insufficient documentation

## 2011-11-26 LAB — CBC WITH DIFFERENTIAL/PLATELET
Basophils Absolute: 0 10*3/uL (ref 0.0–0.1)
Basophils Relative: 0 % (ref 0–1)
Eosinophils Absolute: 0 10*3/uL (ref 0.0–0.7)
Eosinophils Relative: 0 % (ref 0–5)
HCT: 37.3 % (ref 36.0–46.0)
Hemoglobin: 12.3 g/dL (ref 12.0–15.0)
Lymphocytes Relative: 9 % — ABNORMAL LOW (ref 12–46)
Lymphs Abs: 0.7 10*3/uL (ref 0.7–4.0)
MCH: 25.5 pg — ABNORMAL LOW (ref 26.0–34.0)
MCHC: 33 g/dL (ref 30.0–36.0)
MCV: 77.2 fL — ABNORMAL LOW (ref 78.0–100.0)
Monocytes Absolute: 0.4 10*3/uL (ref 0.1–1.0)
Monocytes Relative: 5 % (ref 3–12)
Neutro Abs: 6.5 10*3/uL (ref 1.7–7.7)
Neutrophils Relative %: 86 % — ABNORMAL HIGH (ref 43–77)
Platelets: 271 10*3/uL (ref 150–400)
RBC: 4.83 MIL/uL (ref 3.87–5.11)
RDW: 13 % (ref 11.5–15.5)
WBC: 7.6 10*3/uL (ref 4.0–10.5)

## 2011-11-26 LAB — COMPREHENSIVE METABOLIC PANEL
ALT: 115 U/L — ABNORMAL HIGH (ref 0–35)
AST: 262 U/L — ABNORMAL HIGH (ref 0–37)
Albumin: 3.2 g/dL — ABNORMAL LOW (ref 3.5–5.2)
Alkaline Phosphatase: 154 U/L — ABNORMAL HIGH (ref 39–117)
BUN: 11 mg/dL (ref 6–23)
CO2: 25 mEq/L (ref 19–32)
Calcium: 8.7 mg/dL (ref 8.4–10.5)
Chloride: 94 mEq/L — ABNORMAL LOW (ref 96–112)
Creatinine, Ser: 1.05 mg/dL (ref 0.50–1.10)
GFR calc Af Amer: 60 mL/min — ABNORMAL LOW (ref >90)
GFR calc non Af Amer: 52 mL/min — ABNORMAL LOW (ref >90)
Glucose, Bld: 223 mg/dL — ABNORMAL HIGH (ref 70–99)
Potassium: 3.2 mEq/L — ABNORMAL LOW (ref 3.5–5.1)
Sodium: 135 mEq/L (ref 135–145)
Total Bilirubin: 0.6 mg/dL (ref 0.3–1.2)
Total Protein: 6.7 g/dL (ref 6.0–8.3)

## 2011-11-26 LAB — URINALYSIS, ROUTINE W REFLEX MICROSCOPIC
Bilirubin Urine: NEGATIVE
Glucose, UA: >1000 mg/dL — AB
Ketones, ur: NEGATIVE mg/dL
Leukocytes, UA: NEGATIVE
Nitrite: NEGATIVE
Protein, ur: >300 mg/dL — AB
Specific Gravity, Urine: 1.023 (ref 1.005–1.030)
Urobilinogen, UA: 0.2 mg/dL (ref 0.0–1.0)
pH: 7 (ref 5.0–8.0)

## 2011-11-26 LAB — TROPONIN I: Troponin I: <0.3 ng/mL (ref <0.30)

## 2011-11-26 LAB — GLUCOSE, CAPILLARY: Glucose-Capillary: 385 mg/dL — ABNORMAL HIGH (ref 70–99)

## 2011-11-26 LAB — URINE MICROSCOPIC-ADD ON

## 2011-11-26 MED ORDER — SODIUM CHLORIDE 0.9 % IV BOLUS (SEPSIS): 1000.0000 mL | Freq: Once | INTRAVENOUS | Status: AC

## 2011-11-26 MED ORDER — POTASSIUM CHLORIDE CRYS ER 20 MEQ PO TBCR
40.0000 meq | EXTENDED_RELEASE_TABLET | Freq: Once | ORAL | Status: AC
  Administered 2011-11-26: 40 meq via ORAL
  Filled 2011-11-26: qty 2

## 2011-11-26 NOTE — ED Notes (Signed)
Pt. States feeling lightheaded this AM and calling EMS due to previous instances of becoming unconscious at home. Pt states @ home CBG was 54 so she drank a pepsi and re-checked CBG and machine said "high". Pt. Has not taken insulin today. Pt. C/o of coughing and cramps.

## 2011-11-26 NOTE — ED Notes (Signed)
I gave the patient a happy meal, a cup of ice, and a sprite zero.

## 2011-11-26 NOTE — ED Notes (Signed)
Per EMS- pt reports dizziness since 7am. Pt also had hyperglycemia at 426. BP 182/100. Pt denies taking her BP medications today. NSR on monitor. 99% on room air. 20g in Left forearm.

## 2011-11-26 NOTE — ED Notes (Signed)
Lab in pt. Room

## 2011-11-26 NOTE — ED Provider Notes (Signed)
History     CSN: [id number]  Arrival date & time 11/26/11  1152   First MD Initiated Contact with Patient 11/26/11 1209      Chief Complaint  Patient presents with  . Dizziness  . Hyperglycemia    (Consider location/radiation/quality/duration/timing/severity/associated sxs/prior treatment) HPI The patient presents to the emergency department with elevated blood sugar and dizziness.  The patient denies chest pain, shortness of breath, nausea, vomiting, weakness, numbness, headache, blurred vision, dizziness, or syncope.  Patient states that she gave herself NovoLog prior to arrival to the ER.  Patient states that she did not have any other medications prior to arrival. Past Medical History  Diagnosis Date  . Hypertension   . Diabetes mellitus   . High cholesterol   . Muscle cramps at night     No past surgical history on file.  No family history on file.  History  Substance Use Topics  . Smoking status: Current Every Day Smoker  . Smokeless tobacco: Not on file  . Alcohol Use: No    OB History    Grav Para Term Preterm Abortions TAB SAB Ect Mult Living                  Review of Systems All other systems negative except as documented in the HPI. All pertinent positives and negatives as reviewed in the HPI.  Allergies  Review of patient's allergies indicates no known allergies.  Home Medications   Current Outpatient Rx  Name  Route  Sig  Dispense  Refill  . AMLODIPINE BESYLATE 5 MG PO TABS   Oral   Take 5 mg by mouth daily.         . ASPIRIN EC 81 MG PO TBEC   Oral   Take 81 mg by mouth daily.         . ATORVASTATIN CALCIUM 40 MG PO TABS   Oral   Take 40 mg by mouth daily.         . INSULIN ASPART 100 UNIT/ML [place] SOLN   Subcutaneous   Inject 5-15 Units into the skin daily. Patient only uses if CBG is extremely high. >300         . INSULIN GLARGINE 100 UNIT/ML [place] SOLN   Subcutaneous   Inject 20 Units into the skin daily before breakfast.           BP 183/98  Pulse 93  Temp 98.6 F (37 C) (Oral)  Resp 26  SpO2 100%  Physical Exam  Nursing note and vitals reviewed. Constitutional: She is oriented to person, place, and time. She appears well-developed and well-nourished.  HENT:  Head: Normocephalic and atraumatic.  Mouth/Throat: Oropharynx is clear and moist.  Eyes: Pupils are equal, round, and reactive to light.  Neck: Normal range of motion. Neck supple.  Cardiovascular: Normal rate, regular rhythm and normal heart sounds.  Exam reveals no gallop and no friction rub.   No murmur heard. Pulmonary/Chest: Effort normal and breath sounds normal. No respiratory distress.  Abdominal: Soft. Bowel sounds are normal.  Neurological: She is alert and oriented to person, place, and time.  Skin: Skin is warm and dry. No rash noted.    ED Course  Procedures (including critical care time)  Labs Reviewed  GLUCOSE, CAPILLARY - Abnormal; Notable for the following:    Glucose-Capillary 385 (*)     All other components within normal limits  COMPREHENSIVE METABOLIC PANEL - Abnormal; Notable for the following:    Potassium  3.2 (*)     Chloride 94 (*)     Glucose, Bld 223 (*)     Albumin 3.2 (*)     AST 262 (*)     ALT 115 (*)     Alkaline Phosphatase 154 (*)     GFR calc non Af Amer 52 (*)     GFR calc Af Amer 60 (*)     All other components within normal limits  CBC WITH DIFFERENTIAL - Abnormal; Notable for the following:    MCV 77.2 (*)     MCH 25.5 (*)     Neutrophils Relative 86 (*)     Lymphocytes Relative 9 (*)     All other components within normal limits  URINALYSIS, ROUTINE W REFLEX MICROSCOPIC - Abnormal; Notable for the following:    Glucose, UA >1000 (*)     Hgb urine dipstick TRACE (*)     Protein, ur >300 (*)     All other components within normal limits  GLUCOSE, CAPILLARY - Abnormal; Notable for the following:    Glucose-Capillary 109 (*)     All other components within normal limits  TROPONIN I   URINE MICROSCOPIC-ADD ON   Dg Chest 2 View  11/26/2011  *RADIOLOGY REPORT*  Clinical Data: Dizziness.  Elevated blood sugar.  Frequent urination.  CHEST - 2 VIEW  Comparison: 05/31/2009.  Findings: Emphysema.  No airspace disease or effusion.  Aortic arch atherosclerosis.  Cardiopericardial silhouette appears within normal limits.  IMPRESSION: Emphysema without acute cardiopulmonary disease.   Original Report Authenticated By: Andreas Newport, M.D.     Patient has been stable while here in the emergency department.  Patient is advised followup with her primary care doctor for recheck.  Patient blood sugars responded appropriately to the fluids, and NovoLog.     MDM  MDM Reviewed: vitals and nursing note Interpretation: labs            Carlyle Dolly, PA-C 11/26/11 1550

## 2011-11-27 NOTE — ED Provider Notes (Signed)
Medical screening examination/treatment/procedure(s) were performed by non-physician practitioner and as supervising physician I was immediately available for consultation/collaboration.  Nicholos Aloisi R. Ciro Tashiro, MD 11/27/11 0703 

## 2012-02-13 ENCOUNTER — Emergency Department (HOSPITAL_COMMUNITY): Payer: Medicare Other

## 2012-02-13 ENCOUNTER — Encounter (HOSPITAL_COMMUNITY): Payer: Self-pay | Admitting: *Deleted

## 2012-02-13 ENCOUNTER — Emergency Department (HOSPITAL_COMMUNITY)
Admission: EM | Admit: 2012-02-13 | Discharge: 2012-02-13 | Disposition: A | Discharge status: Home / Self Care | Payer: Medicare Other | Attending: Emergency Medicine | Admitting: Emergency Medicine

## 2012-02-13 DIAGNOSIS — Y929 Unspecified place or not applicable: Secondary | ICD-10-CM | POA: Insufficient documentation

## 2012-02-13 DIAGNOSIS — E119 Type 2 diabetes mellitus without complications: Secondary | ICD-10-CM | POA: Insufficient documentation

## 2012-02-13 DIAGNOSIS — F17200 Nicotine dependence, unspecified, uncomplicated: Secondary | ICD-10-CM | POA: Insufficient documentation

## 2012-02-13 DIAGNOSIS — Z79899 Other long term (current) drug therapy: Secondary | ICD-10-CM | POA: Insufficient documentation

## 2012-02-13 DIAGNOSIS — R296 Repeated falls: Secondary | ICD-10-CM | POA: Insufficient documentation

## 2012-02-13 DIAGNOSIS — Z8739 Personal history of other diseases of the musculoskeletal system and connective tissue: Secondary | ICD-10-CM | POA: Insufficient documentation

## 2012-02-13 DIAGNOSIS — S40019A Contusion of unspecified shoulder, initial encounter: Secondary | ICD-10-CM | POA: Insufficient documentation

## 2012-02-13 DIAGNOSIS — I10 Essential (primary) hypertension: Secondary | ICD-10-CM | POA: Insufficient documentation

## 2012-02-13 DIAGNOSIS — Y9301 Activity, walking, marching and hiking: Secondary | ICD-10-CM | POA: Insufficient documentation

## 2012-02-13 DIAGNOSIS — E7800 Pure hypercholesterolemia, unspecified: Secondary | ICD-10-CM | POA: Insufficient documentation

## 2012-02-13 DIAGNOSIS — Z7982 Long term (current) use of aspirin: Secondary | ICD-10-CM | POA: Insufficient documentation

## 2012-02-13 DIAGNOSIS — Z794 Long term (current) use of insulin: Secondary | ICD-10-CM | POA: Insufficient documentation

## 2012-02-13 MED ORDER — HYDROCODONE-ACETAMINOPHEN 5-325 MG PO TABS: 1.0000 | ORAL_TABLET | ORAL | Status: DC | PRN

## 2012-02-13 MED ORDER — HYDROCODONE-ACETAMINOPHEN 5-325 MG PO TABS
1.0000 | ORAL_TABLET | Freq: Once | ORAL | Status: AC
  Administered 2012-02-13: 1 via ORAL
  Filled 2012-02-13: qty 1

## 2012-02-13 NOTE — ED Provider Notes (Signed)
Medical screening examination/treatment/procedure(s) were conducted as a shared visit with non-physician practitioner(s) and myself.  I personally evaluated the patient during the encounter.  Patient presented with right upper extremity pain from a minor fall. Patient fell from a height of 1 or 2 feet. X-ray was unremarkable.  Lisa Crease, MD 02/13/12 [phone]

## 2012-02-13 NOTE — ED Provider Notes (Signed)
History     CSN: [id number]  Arrival date & time 02/13/12  1109   First MD Initiated Contact with Patient 02/13/12 1126      Chief Complaint  Patient presents with  . Shoulder Pain    (Consider location/radiation/quality/duration/timing/severity/associated sxs/prior treatment) Patient is a 72 y.o. female presenting with fall. The history is provided by the patient. No language interpreter was used.  Fall The accident occurred yesterday. The fall occurred while walking. She fell from a height of 1 to 2 ft. She landed on a hard floor. The point of impact was the right shoulder and right elbow. The pain is present in the right shoulder and right elbow. The pain is at a severity of 5/10. The pain is moderate. She was ambulatory at the scene. There was no drug use involved in the accident. She has tried nothing for the symptoms. The treatment provided no relief.  Pt complains of pain in right shoulder and right elbow  Past Medical History  Diagnosis Date  . Hypertension   . Diabetes mellitus   . High cholesterol   . Muscle cramps at night     History reviewed. No pertinent past surgical history.  History reviewed. No pertinent family history.  History  Substance Use Topics  . Smoking status: Current Every Day Smoker  . Smokeless tobacco: Not on file  . Alcohol Use: No    OB History    Grav Para Term Preterm Abortions TAB SAB Ect Mult Living                  Review of Systems  Musculoskeletal: Positive for joint swelling.  All other systems reviewed and are negative.    Allergies  Lisinopril  Home Medications   Current Outpatient Rx  Name  Route  Sig  Dispense  Refill  . ACYCLOVIR 400 MG PO TABS   Oral   Take 800 mg by mouth every 4 (four) hours while awake.         Marland Kitchen AMLODIPINE BESYLATE 5 MG PO TABS   Oral   Take 5 mg by mouth daily.         . ASPIRIN EC 81 MG PO TBEC   Oral   Take 81 mg by mouth daily.         . ATORVASTATIN CALCIUM 40 MG PO  TABS   Oral   Take 40 mg by mouth daily.         . INSULIN ASPART 100 UNIT/ML [place] SOLN   Subcutaneous   Inject 5-15 Units into the skin daily. Patient only uses if CBG is extremely high. >300         . INSULIN GLARGINE 100 UNIT/ML [place] SOLN   Subcutaneous   Inject 15 Units into the skin daily before breakfast.          . LOSARTAN POTASSIUM 100 MG PO TABS   Oral   Take 100 mg by mouth daily.           BP 230/84  Pulse 83  Temp 97.6 F (36.4 C) (Oral)  Resp 16  SpO2 98%  Physical Exam  Vitals reviewed. Constitutional: She is oriented to person, place, and time. She appears well-developed and well-nourished.  HENT:  Head: Normocephalic and atraumatic.  Eyes: Conjunctivae normal are normal. Pupils are equal, round, and reactive to light.  Neck: Normal range of motion.  Cardiovascular: Normal rate.   Pulmonary/Chest: Effort normal.  Abdominal: Soft.  Musculoskeletal: She exhibits tenderness.  Tender right shoulder, tender right elbow  Neurological: She is alert and oriented to person, place, and time.  Skin: Skin is warm.  Psychiatric: She has a normal mood and affect.    ED Course  Procedures (including critical care time)  Labs Reviewed - No data to display Dg Shoulder Right  02/13/2012  *RADIOLOGY REPORT*  Clinical Data: Shoulder pain.  Fall.  RIGHT SHOULDER - 2+ VIEW  Comparison: None  Findings: Mild degenerative changes in the right AC joint. Glenohumeral joint is intact and grossly unremarkable. No acute bony abnormality.  Specifically, no fracture, subluxation, or dislocation.  Soft tissues are intact.  IMPRESSION: No acute bony abnormality.   Original Report Authenticated By: Charlett Nose, M.D.    Dg Elbow Complete Right  02/13/2012  *RADIOLOGY REPORT*  Clinical Data: Elbow pain.  RIGHT ELBOW - COMPLETE 3+ VIEW  Comparison: None  Findings: No acute bony abnormality.  Specifically, no fracture, subluxation, or dislocation.  Soft tissues are intact.  Joint  spaces are maintained.  Minimal spurring.  No joint effusion.  IMPRESSION: No acute bony abnormality.   Original Report Authenticated By: Charlett Nose, M.D.      1. Contusion of shoulder region       MDM     Pt did not take her blood pressure medication.  I had pt take her medication here.   Lonia Skinner [place], [place] 02/13/12 1320

## 2012-02-13 NOTE — ED Notes (Signed)
Pt reports falling yesterday onto her (R) shoulder.  Pt reports shoulder pain when lifting arm.  No deformity noted.  Pt has good cap refill, sensation.  NAD noted. Denies hitting head.

## 2012-02-13 NOTE — ED Notes (Signed)
Pt. Did not take her BP medication before she came to the ED.  Pt. Just took her BP medications.  Langston Masker, PA is aware.

## 2012-02-13 NOTE — Progress Notes (Signed)
Orthopedic Tech Progress Note Patient Details:  Lisa Carpenter [birth year] [id number] Arm sling applied to Right UE. Tolerated well.  Ortho Devices Type of Ortho Device: Arm sling Ortho Device/Splint Location: Right Ortho Device/Splint Interventions: Application   Asia R Thompson 02/13/2012, 1:06 PM

## 2012-12-04 ENCOUNTER — Emergency Department (INDEPENDENT_AMBULATORY_CARE_PROVIDER_SITE_OTHER)
Admission: EM | Admit: 2012-12-04 | Discharge: 2012-12-04 | Disposition: A | Discharge status: Home / Self Care | Payer: Medicare Other | Source: Home / Self Care | Attending: Family Medicine | Admitting: Family Medicine

## 2012-12-04 ENCOUNTER — Encounter (HOSPITAL_COMMUNITY): Payer: Self-pay | Admitting: Emergency Medicine

## 2012-12-04 DIAGNOSIS — M542 Cervicalgia: Secondary | ICD-10-CM

## 2012-12-04 DIAGNOSIS — I10 Essential (primary) hypertension: Secondary | ICD-10-CM

## 2012-12-04 MED ORDER — LOSARTAN POTASSIUM-HCTZ 100-25 MG PO TABS: 1.0000 | ORAL_TABLET | Freq: Every day | ORAL | Status: DC

## 2012-12-04 MED ORDER — TRAMADOL HCL 50 MG PO TABS: 50.0000 mg | ORAL_TABLET | Freq: Four times a day (QID) | ORAL | Status: DC | PRN

## 2012-12-04 NOTE — ED Notes (Signed)
Pt c/o neck pain onset 3 days ago when she awoke Denies inj/trauma, strenuous activity BP today is 234/112... Denies: CP, SOB, BV, HA, weakness, nauseas, diaphoresis Reports she has not had her BP meds since 11/22/12 She is alert and oriented w/no signs of acute distress.

## 2012-12-04 NOTE — ED Provider Notes (Signed)
Lisa Carpenter is a 72 y.o. female who presents to Urgent Care today for neck pain. Patient has bilateral, lateral neck pain worse on right. This is been present for about 3 days. She denies any radiating pain weakness or numbness. She denies any injury. She's tried some Aleve which has not helped.  Of note patient filled but lost her blood pressure but medication a few weeks ago. She is not currently taking any blood pressure medication. She denies any chest pains palpitations or shortness of breath.   Past Medical History  Diagnosis Date  . Hypertension   . Diabetes mellitus   . High cholesterol   . Muscle cramps at night    History  Substance Use Topics  . Smoking status: Current Every Day Smoker  . Smokeless tobacco: Not on file  . Alcohol Use: No   ROS as above Medications reviewed. No current facility-administered medications for this encounter.   Current Outpatient Prescriptions  Medication Sig Dispense Refill  . atorvastatin (LIPITOR) 40 MG tablet Take 40 mg by mouth daily.      . insulin aspart (NOVOLOG) 100 UNIT/ML injection Inject 5-15 Units into the skin daily. Patient only uses if CBG is extremely high. >300      . insulin glargine (LANTUS) 100 UNIT/ML injection Inject 15 Units into the skin daily before breakfast.       . acyclovir (ZOVIRAX) 400 MG tablet Take 800 mg by mouth every 4 (four) hours while awake.      Marland Kitchen aspirin EC 81 MG tablet Take 81 mg by mouth daily.      Marland Kitchen HYDROcodone-acetaminophen (NORCO/VICODIN) 5-325 MG per tablet Take 1 tablet by mouth every 4 (four) hours as needed for pain.  10 tablet  0  . losartan-hydrochlorothiazide (HYZAAR) 100-25 MG per tablet Take 1 tablet by mouth daily.      . traMADol (ULTRAM) 50 MG tablet Take 1 tablet (50 mg total) by mouth every 6 (six) hours as needed.  15 tablet  0    Exam:  BP 234/112  Pulse 70  Temp(Src) 97.9 F (36.6 C) (Oral)  Resp 19  SpO2 99% Gen: Well NAD HEENT: EOMI,  MMM Lungs: CTABL Nl  WOB Heart: RRR no MRG Abd: NABS, NT, ND Exts: Non edematous BL  LE, warm and well perfused.  Neck: Nontender to spinal midline. Tender to palpation bilateral trapezius. Decreased range of motion due to pain. Negative Spurling's test. Upper extremity strength sensation are intact bilaterally.  No results found for this or any previous visit (from the past 24 hour(s)). No results found.  Assessment and Plan: 72 y.o. female with  1)neck muscle pain. Plan to treat with tramadol. We'll avoid NSAIDs due to blood pressure, prednisone due to diabetes, and muscle relaxers due to patient's age. Additionally we'll use a heating pad and followup with primary care provider. 2) hypertension: I called the patient's pharmacy. She can get 7 tablets for $18 and then get her next month prescription in about one week. She will obtain these medications and followup with her primary care provider. Discussed warning signs or symptoms. Please see discharge instructions. Patient expresses understanding.      Rodolph Bong, MD 12/04/12 [phone]

## 2012-12-05 ENCOUNTER — Encounter (HOSPITAL_COMMUNITY): Payer: Self-pay | Admitting: Emergency Medicine

## 2012-12-05 ENCOUNTER — Emergency Department (HOSPITAL_COMMUNITY)
Admission: EM | Admit: 2012-12-05 | Discharge: 2012-12-05 | Disposition: A | Discharge status: Home / Self Care | Payer: Medicare Other | Attending: Emergency Medicine | Admitting: Emergency Medicine

## 2012-12-05 DIAGNOSIS — Z7982 Long term (current) use of aspirin: Secondary | ICD-10-CM | POA: Insufficient documentation

## 2012-12-05 DIAGNOSIS — E7800 Pure hypercholesterolemia, unspecified: Secondary | ICD-10-CM | POA: Insufficient documentation

## 2012-12-05 DIAGNOSIS — F17200 Nicotine dependence, unspecified, uncomplicated: Secondary | ICD-10-CM | POA: Insufficient documentation

## 2012-12-05 DIAGNOSIS — Z794 Long term (current) use of insulin: Secondary | ICD-10-CM | POA: Insufficient documentation

## 2012-12-05 DIAGNOSIS — I10 Essential (primary) hypertension: Secondary | ICD-10-CM | POA: Insufficient documentation

## 2012-12-05 DIAGNOSIS — Z79899 Other long term (current) drug therapy: Secondary | ICD-10-CM | POA: Insufficient documentation

## 2012-12-05 DIAGNOSIS — E1169 Type 2 diabetes mellitus with other specified complication: Secondary | ICD-10-CM | POA: Insufficient documentation

## 2012-12-05 DIAGNOSIS — E162 Hypoglycemia, unspecified: Secondary | ICD-10-CM

## 2012-12-05 LAB — URINALYSIS, ROUTINE W REFLEX MICROSCOPIC
Bilirubin Urine: NEGATIVE
Glucose, UA: 100 mg/dL — AB
Ketones, ur: NEGATIVE mg/dL
Leukocytes, UA: NEGATIVE
Nitrite: NEGATIVE
Protein, ur: 100 mg/dL — AB
Specific Gravity, Urine: 1.007 (ref 1.005–1.030)
Urobilinogen, UA: 0.2 mg/dL (ref 0.0–1.0)
pH: 7 (ref 5.0–8.0)

## 2012-12-05 LAB — BASIC METABOLIC PANEL
BUN: 10 mg/dL (ref 6–23)
CO2: 25 mEq/L (ref 19–32)
Calcium: 8.8 mg/dL (ref 8.4–10.5)
Chloride: 97 mEq/L (ref 96–112)
Creatinine, Ser: 0.87 mg/dL (ref 0.50–1.10)
GFR calc Af Amer: 75 mL/min — ABNORMAL LOW (ref >90)
GFR calc non Af Amer: 65 mL/min — ABNORMAL LOW (ref >90)
Glucose, Bld: 170 mg/dL — ABNORMAL HIGH (ref 70–99)
Potassium: 4.6 mEq/L (ref 3.5–5.1)
Sodium: 131 mEq/L — ABNORMAL LOW (ref 135–145)

## 2012-12-05 LAB — URINE MICROSCOPIC-ADD ON

## 2012-12-05 LAB — CBC
HCT: 33.5 % — ABNORMAL LOW (ref 36.0–46.0)
Hemoglobin: 11 g/dL — ABNORMAL LOW (ref 12.0–15.0)
MCH: 26.1 pg (ref 26.0–34.0)
MCHC: 32.8 g/dL (ref 30.0–36.0)
MCV: 79.4 fL (ref 78.0–100.0)
Platelets: 182 10*3/uL (ref 150–400)
RBC: 4.22 MIL/uL (ref 3.87–5.11)
RDW: 14.1 % (ref 11.5–15.5)
WBC: 5.2 10*3/uL (ref 4.0–10.5)

## 2012-12-05 MED ORDER — NEBIVOLOL HCL 5 MG PO TABS
5.0000 mg | ORAL_TABLET | Freq: Once | ORAL | Status: AC
  Administered 2012-12-05: 5 mg via ORAL
  Filled 2012-12-05: qty 1

## 2012-12-05 MED ORDER — HYDROCHLOROTHIAZIDE 25 MG PO TABS
25.0000 mg | ORAL_TABLET | Freq: Once | ORAL | Status: AC
  Administered 2012-12-05: 25 mg via ORAL
  Filled 2012-12-05: qty 1

## 2012-12-05 MED ORDER — LOSARTAN POTASSIUM-HCTZ 100-25 MG PO TABS: 1.0000 | ORAL_TABLET | Freq: Once | ORAL | Status: DC

## 2012-12-05 MED ORDER — LOSARTAN POTASSIUM 50 MG PO TABS
100.0000 mg | ORAL_TABLET | Freq: Once | ORAL | Status: AC
  Administered 2012-12-05: 100 mg via ORAL
  Filled 2012-12-05: qty 2

## 2012-12-05 NOTE — ED Notes (Signed)
Per EMS pt is from home and EMS was called for a "sick call". Pt was found to be hypoglycemic with CBG 22. 25g d10 given CBG 196. Last CBG 137. BP 240/120. Pulse 80-110. 97% RA. Denies pain, N/V.

## 2012-12-05 NOTE — ED Provider Notes (Addendum)
Medical screening examination/treatment/procedure(s) were conducted as a shared visit with non-physician practitioner(s) and myself.  I personally evaluated the patient during the encounter.  EKG Interpretation   None        Pt with transient hypoglycemia related to not eating dinner last night. BP elevated today but has been out of meds for several days. Given home meds here with improvement. No EOD. Plan discharge.   Charles B. Bernette Mayers, MD 12/05/12 [phone]

## 2012-12-05 NOTE — ED Notes (Signed)
Carb modified diet ordered 

## 2012-12-05 NOTE — ED Provider Notes (Signed)
CSN: [id number]     Arrival date & time 12/05/12  1012 History   First MD Initiated Contact with Patient 12/05/12 1023     Chief Complaint  Patient presents with  . Hypoglycemia  . Hypertension   (Consider location/radiation/quality/duration/timing/severity/associated sxs/prior Treatment) HPI Patient presents to the emergency department with hypoglycemic event.  Patient, apparently had a hypoglycemic episode this morning.  The patient did not eat.  Patient denies chest pain, shortness breath, nausea, vomiting, diarrhea, headache, dizziness, abdominal pain, or fever.  Patient, states she's had hypoglycemic episodes in the past.  The patient was taking her medicines last night, but did not eat any food.  Patient, also has not taken her blood pressure medication Past Medical History  Diagnosis Date  . Hypertension   . Diabetes mellitus   . High cholesterol   . Muscle cramps at night    History reviewed. No pertinent past surgical history. History reviewed. No pertinent family history. History  Substance Use Topics  . Smoking status: Current Every Day Smoker  . Smokeless tobacco: Not on file  . Alcohol Use: No   OB History   Grav Para Term Preterm Abortions TAB SAB Ect Mult Living                 Review of Systems All other systems negative except as documented in the HPI. All pertinent positives and negatives as reviewed in the HPI. Allergies  Lisinopril  Home Medications   Current Outpatient Rx  Name  Route  Sig  Dispense  Refill  . aspirin EC 81 MG tablet   Oral   Take 81 mg by mouth every morning.          Marland Kitchen atorvastatin (LIPITOR) 40 MG tablet   Oral   Take 40 mg by mouth every morning.          . capsicum (ZOSTRIX) 0.075 % topical cream   Topical   Apply 1 application topically daily as needed (Muscle pain).         . insulin glargine (LANTUS) 100 UNIT/ML injection   Subcutaneous   Inject 15 Units into the skin daily before breakfast.          .  magnesium gluconate (MAGONATE) 500 MG tablet   Oral   Take 500 mg by mouth every morning.         . nebivolol (BYSTOLIC) 5 MG tablet   Oral   Take 5 mg by mouth every morning.         . traMADol (ULTRAM) 50 MG tablet   Oral   Take 1 tablet (50 mg total) by mouth every 6 (six) hours as needed.   15 tablet   0   . losartan-hydrochlorothiazide (HYZAAR) 100-25 MG per tablet   Oral   Take 1 tablet by mouth daily.          BP 182/61  Pulse 70  Temp(Src) 97.4 F (36.3 C) (Oral)  Resp 13  SpO2 94% Physical Exam  Nursing note and vitals reviewed. Constitutional: She is oriented to person, place, and time. She appears well-developed and well-nourished. No distress.  HENT:  Head: Normocephalic and atraumatic.  Right Ear: External ear normal.  Mouth/Throat: No oropharyngeal exudate.  Eyes: Pupils are equal, round, and reactive to light.  Cardiovascular: Normal rate, regular rhythm and normal heart sounds.  Exam reveals no gallop and no friction rub.   No murmur heard. Pulmonary/Chest: Effort normal and breath sounds normal. No respiratory distress.  Neurological:  She is alert and oriented to person, place, and time. She exhibits normal muscle tone. Coordination normal.  Skin: Skin is warm and dry. No rash noted.    ED Course  Procedures (including critical care time) Labs Review Labs Reviewed  GLUCOSE, CAPILLARY - Abnormal; Notable for the following:    Glucose-Capillary 115 (*)    All other components within normal limits  BASIC METABOLIC PANEL - Abnormal; Notable for the following:    Sodium 131 (*)    Glucose, Bld 170 (*)    GFR calc non Af Amer 65 (*)    GFR calc Af Amer 75 (*)    All other components within normal limits  CBC - Abnormal; Notable for the following:    Hemoglobin 11.0 (*)    HCT 33.5 (*)    All other components within normal limits  URINALYSIS, ROUTINE W REFLEX MICROSCOPIC - Abnormal; Notable for the following:    Glucose, UA 100 (*)    Hgb  urine dipstick SMALL (*)    Protein, ur 100 (*)    All other components within normal limits  URINE MICROSCOPIC-ADD ON - Abnormal; Notable for the following:    Squamous Epithelial / LPF FEW (*)    All other components within normal limits   Patient's blood sugar has responded well to treatment, here in the emergency department.  The patient been no distress.  Patient's blood pressure is most likely due to the fact she has not taken regular medicines.  Patient is advised followup with her primary care Dr. and told to return here as needed  Carlyle Dolly, PA-C 12/05/12 1438

## 2012-12-05 NOTE — ED Notes (Signed)
PA at bedside.

## 2013-01-24 ENCOUNTER — Emergency Department (HOSPITAL_COMMUNITY): Payer: Medicare Other

## 2013-01-24 ENCOUNTER — Emergency Department (HOSPITAL_COMMUNITY)
Admission: EM | Admit: 2013-01-24 | Discharge: 2013-01-24 | Disposition: A | Discharge status: Home / Self Care | Payer: Medicare Other | Attending: Emergency Medicine | Admitting: Emergency Medicine

## 2013-01-24 ENCOUNTER — Encounter (HOSPITAL_COMMUNITY): Payer: Self-pay | Admitting: Emergency Medicine

## 2013-01-24 DIAGNOSIS — Z79899 Other long term (current) drug therapy: Secondary | ICD-10-CM | POA: Insufficient documentation

## 2013-01-24 DIAGNOSIS — Z794 Long term (current) use of insulin: Secondary | ICD-10-CM | POA: Insufficient documentation

## 2013-01-24 DIAGNOSIS — E7800 Pure hypercholesterolemia, unspecified: Secondary | ICD-10-CM | POA: Insufficient documentation

## 2013-01-24 DIAGNOSIS — Z8742 Personal history of other diseases of the female genital tract: Secondary | ICD-10-CM | POA: Insufficient documentation

## 2013-01-24 DIAGNOSIS — E119 Type 2 diabetes mellitus without complications: Secondary | ICD-10-CM | POA: Insufficient documentation

## 2013-01-24 DIAGNOSIS — I10 Essential (primary) hypertension: Secondary | ICD-10-CM | POA: Insufficient documentation

## 2013-01-24 DIAGNOSIS — F17200 Nicotine dependence, unspecified, uncomplicated: Secondary | ICD-10-CM | POA: Insufficient documentation

## 2013-01-24 DIAGNOSIS — Z7982 Long term (current) use of aspirin: Secondary | ICD-10-CM | POA: Insufficient documentation

## 2013-01-24 LAB — BASIC METABOLIC PANEL
BUN: 12 mg/dL (ref 6–23)
CALCIUM: 8.8 mg/dL (ref 8.4–10.5)
CO2: 27 meq/L (ref 19–32)
CREATININE: 1.06 mg/dL (ref 0.50–1.10)
Chloride: 86 mEq/L — ABNORMAL LOW (ref 96–112)
GFR calc Af Amer: 59 mL/min — ABNORMAL LOW (ref >90)
GFR, EST NON AFRICAN AMERICAN: 51 mL/min — AB (ref >90)
GLUCOSE: 350 mg/dL — AB (ref 70–99)
Potassium: 4 mEq/L (ref 3.7–5.3)
SODIUM: 125 meq/L — AB (ref 137–147)

## 2013-01-24 LAB — CBC
HEMATOCRIT: 34.8 % — AB (ref 36.0–46.0)
HEMOGLOBIN: 11.7 g/dL — AB (ref 12.0–15.0)
MCH: 25.7 pg — ABNORMAL LOW (ref 26.0–34.0)
MCHC: 33.6 g/dL (ref 30.0–36.0)
MCV: 76.3 fL — AB (ref 78.0–100.0)
Platelets: 198 10*3/uL (ref 150–400)
RBC: 4.56 MIL/uL (ref 3.87–5.11)
RDW: 13 % (ref 11.5–15.5)
WBC: 5.8 10*3/uL (ref 4.0–10.5)

## 2013-01-24 LAB — URINE MICROSCOPIC-ADD ON

## 2013-01-24 LAB — URINALYSIS, ROUTINE W REFLEX MICROSCOPIC
BILIRUBIN URINE: NEGATIVE
GLUCOSE, UA: 500 mg/dL — AB
KETONES UR: NEGATIVE mg/dL
Leukocytes, UA: NEGATIVE
Nitrite: NEGATIVE
PROTEIN: 100 mg/dL — AB
Specific Gravity, Urine: 1.008 (ref 1.005–1.030)
UROBILINOGEN UA: 1 mg/dL (ref 0.0–1.0)
pH: 7.5 (ref 5.0–8.0)

## 2013-01-24 MED ORDER — SODIUM CHLORIDE 0.9 % IV BOLUS (SEPSIS)
1000.0000 mL | Freq: Once | INTRAVENOUS | Status: AC
  Administered 2013-01-24: 1000 mL via INTRAVENOUS

## 2013-01-24 NOTE — Discharge Instructions (Signed)
Please see Dr. Sharyn Lull in the office tomorrow for a recheck of your blood pressure.  Arterial Hypertension Arterial hypertension (high blood pressure) is a condition of elevated pressure in your blood vessels. Hypertension over a long period of time is a risk factor for strokes, heart attacks, and heart failure. It is also the leading cause of kidney (renal) failure.  CAUSES   In Adults -- Over 90% of all hypertension has no known cause. This is called essential or primary hypertension. In the other 10% of people with hypertension, the increase in blood pressure is caused by another disorder. This is called secondary hypertension. Important causes of secondary hypertension are:  Heavy alcohol use.  Obstructive sleep apnea.  Hyperaldosterosim (Conn's syndrome).  Steroid use.  Chronic kidney failure.  Hyperparathyroidism.  Medications.  Renal artery stenosis.  Pheochromocytoma.  Cushing's disease.  Coarctation of the aorta.  Scleroderma renal crisis.  Licorice (in excessive amounts).  Drugs (cocaine, methamphetamine). Your caregiver can explain any items above that apply to you.  In Children -- Secondary hypertension is more common and should always be considered.  Pregnancy -- Few women of childbearing age have high blood pressure. However, up to 10% of them develop hypertension of pregnancy. Generally, this will not harm the woman. It may be a sign of 3 complications of pregnancy: preeclampsia, HELLP syndrome, and eclampsia. Follow up and control with medication is necessary. SYMPTOMS   This condition normally does not produce any noticeable symptoms. It is usually found during a routine exam.  Malignant hypertension is a late problem of high blood pressure. It may have the following symptoms:  Headaches.  Blurred vision.  End-organ damage (this means your kidneys, heart, lungs, and other organs are being damaged).  Stressful situations can increase the blood  pressure. If a person with normal blood pressure has their blood pressure go up while being seen by their caregiver, this is often termed "white coat hypertension." Its importance is not known. It may be related with eventually developing hypertension or complications of hypertension.  Hypertension is often confused with mental tension, stress, and anxiety. DIAGNOSIS  The diagnosis is made by 3 separate blood pressure measurements. They are taken at least 1 week apart from each other. If there is organ damage from hypertension, the diagnosis may be made without repeat measurements. Hypertension is usually identified by having blood pressure readings:  Above 140/90 mmHg measured in both arms, at 3 separate times, over a couple weeks.  Over 130/80 mmHg should be considered a risk factor and may require treatment in patients with diabetes. Blood pressure readings over 120/80 mmHg are called "pre-hypertension" even in non-diabetic patients. To get a true blood pressure measurement, use the following guidelines. Be aware of the factors that can alter blood pressure readings.  Take measurements at least 1 hour after caffeine.  Take measurements 30 minutes after smoking and without any stress. This is another reason to quit smoking  it raises your blood pressure.  Use a proper cuff size. Ask your caregiver if you are not sure about your cuff size.  Most home blood pressure cuffs are automatic. They will measure systolic and diastolic pressures. The systolic pressure is the pressure reading at the start of sounds. Diastolic pressure is the pressure at which the sounds disappear. If you are elderly, measure pressures in multiple postures. Try sitting, lying or standing.  Sit at rest for a minimum of 5 minutes before taking measurements.  You should not be on any medications like  decongestants. These are found in many cold medications.  Record your blood pressure readings and review them with your  caregiver. If you have hypertension:  Your caregiver may do tests to be sure you do not have secondary hypertension (see "causes" above).  Your caregiver may also look for signs of metabolic syndrome. This is also called Syndrome X or Insulin Resistance Syndrome. You may have this syndrome if you have type 2 diabetes, abdominal obesity, and abnormal blood lipids in addition to hypertension.  Your caregiver will take your medical and family history and perform a physical exam.  Diagnostic tests may include blood tests (for glucose, cholesterol, potassium, and kidney function), a urinalysis, or an EKG. Other tests may also be necessary depending on your condition. PREVENTION  There are important lifestyle issues that you can adopt to reduce your chance of developing hypertension:  Maintain a normal weight.  Limit the amount of salt (sodium) in your diet.  Exercise often.  Limit alcohol intake.  Get enough potassium in your diet. Discuss specific advice with your caregiver.  Follow a DASH diet (dietary approaches to stop hypertension). This diet is rich in fruits, vegetables, and low-fat dairy products, and avoids certain fats. PROGNOSIS  Essential hypertension cannot be cured. Lifestyle changes and medical treatment can lower blood pressure and reduce complications. The prognosis of secondary hypertension depends on the underlying cause. Many people whose hypertension is controlled with medicine or lifestyle changes can live a normal, healthy life.  RISKS AND COMPLICATIONS  While high blood pressure alone is not an illness, it often requires treatment due to its short- and long-term effects on many organs. Hypertension increases your risk for:  CVAs or strokes (cerebrovascular accident).  Heart failure due to chronically high blood pressure (hypertensive cardiomyopathy).  Heart attack (myocardial infarction).  Damage to the retina (hypertensive retinopathy).  Kidney failure  (hypertensive nephropathy). Your caregiver can explain list items above that apply to you. Treatment of hypertension can significantly reduce the risk of complications. TREATMENT   For overweight patients, weight loss and regular exercise are recommended. Physical fitness lowers blood pressure.  Mild hypertension is usually treated with diet and exercise. A diet rich in fruits and vegetables, fat-free dairy products, and foods low in fat and salt (sodium) can help lower blood pressure. Decreasing salt intake decreases blood pressure in a 1/3 of people.  Stop smoking if you are a smoker. The steps above are highly effective in reducing blood pressure. While these actions are easy to suggest, they are difficult to achieve. Most patients with moderate or severe hypertension end up requiring medications to bring their blood pressure down to a normal level. There are several classes of medications for treatment. Blood pressure pills (antihypertensives) will lower blood pressure by their different actions. Lowering the blood pressure by 10 mmHg may decrease the risk of complications by as much as 25%. The goal of treatment is effective blood pressure control. This will reduce your risk for complications. Your caregiver will help you determine the best treatment for you according to your lifestyle. What is excellent treatment for one person, may not be for you. HOME CARE INSTRUCTIONS   Do not smoke.  Follow the lifestyle changes outlined in the "Prevention" section.  If you are on medications, follow the directions carefully. Blood pressure medications must be taken as prescribed. Skipping doses reduces their benefit. It also puts you at risk for problems.  Follow up with your caregiver, as directed.  If you are asked to monitor  your blood pressure at home, follow the guidelines in the "Diagnosis" section above. SEEK MEDICAL CARE IF:   You think you are having medication side effects.  You have  recurrent headaches or lightheadedness.  You have swelling in your ankles.  You have trouble with your vision. SEEK IMMEDIATE MEDICAL CARE IF:   You have sudden onset of chest pain or pressure, difficulty breathing, or other symptoms of a heart attack.  You have a severe headache.  You have symptoms of a stroke (such as sudden weakness, difficulty speaking, difficulty walking). MAKE SURE YOU:   Understand these instructions.  Will watch your condition.  Will get help right away if you are not doing well or get worse. Document Released: 01/06/2005 Document Revised: 03/31/2011 Document Reviewed: 08/06/2006 [facility] Patient Information 2014 [place], [place].

## 2013-01-24 NOTE — ED Notes (Signed)
Pt. Arrived with high BP and hyperglycemia.  Pt. Is having no symptoms,  But was worried her BP was too high, 242/116,  Pt. Took her BP meds and also her insulin.  Pt. Is alert and oriented X3, denies any pain.  Pt. Is from home

## 2013-01-24 NOTE — ED Provider Notes (Addendum)
CSN: [id number]     Arrival date & time 01/24/13  1828 History   First MD Initiated Contact with Patient 01/24/13 1826     Chief Complaint  Patient presents with  . Hypertension   (Consider location/radiation/quality/duration/timing/severity/associated sxs/prior Treatment) Patient is a 73 y.o. female presenting with hypertension. The history is provided by the patient.  Hypertension This is a chronic problem. The current episode started less than 1 hour ago. The problem occurs constantly. The problem has not changed since onset.Pertinent negatives include no chest pain, no abdominal pain and no shortness of breath. Nothing aggravates the symptoms. Nothing relieves the symptoms.    Past Medical History  Diagnosis Date  . Hypertension   . Diabetes mellitus   . High cholesterol   . Muscle cramps at night    History reviewed. No pertinent past surgical history. No family history on file. History  Substance Use Topics  . Smoking status: Current Every Day Smoker  . Smokeless tobacco: Not on file  . Alcohol Use: No   OB History   Grav Para Term Preterm Abortions TAB SAB Ect Mult Living                 Review of Systems  Constitutional: Negative for fever and chills.  Respiratory: Negative for cough and shortness of breath.   Cardiovascular: Negative for chest pain and leg swelling.  Gastrointestinal: Negative for vomiting and abdominal pain.  All other systems reviewed and are negative.    Allergies  Lisinopril  Home Medications   Current Outpatient Rx  Name  Route  Sig  Dispense  Refill  . aspirin EC 81 MG tablet   Oral   Take 81 mg by mouth every morning.          Marland Kitchen atorvastatin (LIPITOR) 40 MG tablet   Oral   Take 40 mg by mouth every morning.          . capsicum (ZOSTRIX) 0.075 % topical cream   Topical   Apply 1 application topically daily as needed (Muscle pain).         . insulin glargine (LANTUS) 100 UNIT/ML injection   Subcutaneous   Inject 15  Units into the skin daily before breakfast.          . losartan-hydrochlorothiazide (HYZAAR) 100-25 MG per tablet   Oral   Take 1 tablet by mouth daily.         . magnesium gluconate (MAGONATE) 500 MG tablet   Oral   Take 500 mg by mouth every morning.         . nebivolol (BYSTOLIC) 5 MG tablet   Oral   Take 5 mg by mouth every morning.         . traMADol (ULTRAM) 50 MG tablet   Oral   Take 50 mg by mouth every 6 (six) hours as needed for moderate pain.          BP 226/85  Pulse 68  Temp(Src) 98.7 F (37.1 C) (Oral)  Resp 14  SpO2 100% Physical Exam  Nursing note and vitals reviewed. Constitutional: She is oriented to person, place, and time. She appears well-developed and well-nourished. No distress.  HENT:  Head: Normocephalic and atraumatic.  Eyes: EOM are normal. Pupils are equal, round, and reactive to light.  Neck: Normal range of motion. Neck supple.  Cardiovascular: Normal rate and regular rhythm.  Exam reveals no friction rub.   No murmur heard. Pulmonary/Chest: Effort normal and breath sounds normal.  No respiratory distress. She has no wheezes. She has no rales.  Abdominal: Soft. She exhibits no distension. There is no tenderness. There is no rebound.  Musculoskeletal: Normal range of motion. She exhibits no edema.  Neurological: She is alert and oriented to person, place, and time.  Skin: She is not diaphoretic.    ED Course  Procedures (including critical care time) Labs Review Labs Reviewed  BASIC METABOLIC PANEL  URINALYSIS, ROUTINE W REFLEX MICROSCOPIC  CBC   Imaging Review No results found.  EKG Interpretation    Date/Time:  Monday January 24 2013 18:32:11 EST Ventricular Rate:  66 PR Interval:  191 QRS Duration: 88 QT Interval:  432 QTC Calculation: 453 R Axis:   -36 Text Interpretation:  Sinus rhythm Probable left atrial enlargement Left axis deviation No prior  EKG to compare Confirmed by [facility]  MD, Odis Wickey ([id number]) on 01/24/2013  7:05:05 PM            MDM  No diagnosis found. 66F presents with hypertension. Her brother is in the hospital, and she became worried about her own health. She took her BP and it was elevated. Her blood sugar was also elevated. Once she noted her BP and blood sugar were elevated, she has a brief episode of blurry vision. This has resolved now. She denies any chest pain, SOB, numbness/weakness, vomiting, headache, dizziness. She had no symptoms prior to checking her own BP.  Here, hypertensive, 220s/80s. She has been taking all of her meds per her, takes losartan/HCTZ and nebivolol for her BP. Neuro exam normal, relaxing comfortably. EKG unchanged. Will scan her head and check basic labs.    Dagmar HaitWilliam Araminta Zorn, MD 01/25/13 0019  Dagmar HaitWilliam Hector Taft, MD 01/25/13 [phone]

## 2013-04-18 ENCOUNTER — Other Ambulatory Visit: Payer: Self-pay | Admitting: Cardiology

## 2013-06-07 ENCOUNTER — Other Ambulatory Visit (HOSPITAL_COMMUNITY): Payer: Self-pay | Admitting: Ophthalmology

## 2013-06-07 DIAGNOSIS — H532 Diplopia: Secondary | ICD-10-CM

## 2013-06-08 ENCOUNTER — Ambulatory Visit (HOSPITAL_COMMUNITY): Payer: Medicare Other

## 2013-06-26 ENCOUNTER — Encounter (HOSPITAL_COMMUNITY): Payer: Self-pay | Admitting: Emergency Medicine

## 2013-06-26 ENCOUNTER — Inpatient Hospital Stay (HOSPITAL_COMMUNITY)
Admission: EM | Admit: 2013-06-26 | Discharge: 2013-07-01 | DRG: 638 | Disposition: A | Discharge status: Home Health | Payer: Medicare Other | Attending: Cardiology | Admitting: Cardiology

## 2013-06-26 DIAGNOSIS — IMO0002 Reserved for concepts with insufficient information to code with codable children: Principal | ICD-10-CM | POA: Diagnosis present

## 2013-06-26 DIAGNOSIS — K2960 Other gastritis without bleeding: Secondary | ICD-10-CM | POA: Diagnosis present

## 2013-06-26 DIAGNOSIS — D509 Iron deficiency anemia, unspecified: Secondary | ICD-10-CM | POA: Diagnosis present

## 2013-06-26 DIAGNOSIS — Z79899 Other long term (current) drug therapy: Secondary | ICD-10-CM

## 2013-06-26 DIAGNOSIS — E875 Hyperkalemia: Secondary | ICD-10-CM | POA: Diagnosis present

## 2013-06-26 DIAGNOSIS — Z7982 Long term (current) use of aspirin: Secondary | ICD-10-CM

## 2013-06-26 DIAGNOSIS — Z91041 Radiographic dye allergy status: Secondary | ICD-10-CM

## 2013-06-26 DIAGNOSIS — Z888 Allergy status to other drugs, medicaments and biological substances status: Secondary | ICD-10-CM

## 2013-06-26 DIAGNOSIS — Z794 Long term (current) use of insulin: Secondary | ICD-10-CM

## 2013-06-26 DIAGNOSIS — I129 Hypertensive chronic kidney disease with stage 1 through stage 4 chronic kidney disease, or unspecified chronic kidney disease: Secondary | ICD-10-CM | POA: Diagnosis present

## 2013-06-26 DIAGNOSIS — F17200 Nicotine dependence, unspecified, uncomplicated: Secondary | ICD-10-CM | POA: Diagnosis present

## 2013-06-26 DIAGNOSIS — N179 Acute kidney failure, unspecified: Secondary | ICD-10-CM | POA: Diagnosis present

## 2013-06-26 DIAGNOSIS — E7800 Pure hypercholesterolemia, unspecified: Secondary | ICD-10-CM | POA: Diagnosis present

## 2013-06-26 DIAGNOSIS — N390 Urinary tract infection, site not specified: Secondary | ICD-10-CM | POA: Diagnosis present

## 2013-06-26 DIAGNOSIS — R252 Cramp and spasm: Secondary | ICD-10-CM | POA: Diagnosis present

## 2013-06-26 DIAGNOSIS — E1169 Type 2 diabetes mellitus with other specified complication: Principal | ICD-10-CM

## 2013-06-26 DIAGNOSIS — E785 Hyperlipidemia, unspecified: Secondary | ICD-10-CM | POA: Diagnosis present

## 2013-06-26 DIAGNOSIS — E1165 Type 2 diabetes mellitus with hyperglycemia: Principal | ICD-10-CM | POA: Diagnosis present

## 2013-06-26 DIAGNOSIS — E871 Hypo-osmolality and hyponatremia: Secondary | ICD-10-CM | POA: Diagnosis present

## 2013-06-26 DIAGNOSIS — R739 Hyperglycemia, unspecified: Secondary | ICD-10-CM

## 2013-06-26 DIAGNOSIS — K2980 Duodenitis without bleeding: Secondary | ICD-10-CM | POA: Diagnosis present

## 2013-06-26 DIAGNOSIS — N182 Chronic kidney disease, stage 2 (mild): Secondary | ICD-10-CM | POA: Diagnosis present

## 2013-06-26 LAB — URINE MICROSCOPIC-ADD ON

## 2013-06-26 LAB — CBC
HCT: 30 % — ABNORMAL LOW (ref 36.0–46.0)
Hemoglobin: 9.9 g/dL — ABNORMAL LOW (ref 12.0–15.0)
MCH: 24.5 pg — ABNORMAL LOW (ref 26.0–34.0)
MCHC: 33 g/dL (ref 30.0–36.0)
MCV: 74.3 fL — AB (ref 78.0–100.0)
PLATELETS: 246 10*3/uL (ref 150–400)
RBC: 4.04 MIL/uL (ref 3.87–5.11)
RDW: 13.5 % (ref 11.5–15.5)
WBC: 5.1 10*3/uL (ref 4.0–10.5)

## 2013-06-26 LAB — URINALYSIS, ROUTINE W REFLEX MICROSCOPIC
Bilirubin Urine: NEGATIVE
Glucose, UA: >1000 mg/dL — AB
Ketones, ur: NEGATIVE mg/dL
Nitrite: NEGATIVE
PH: 6 (ref 5.0–8.0)
PROTEIN: 100 mg/dL — AB
Specific Gravity, Urine: 1.019 (ref 1.005–1.030)
Urobilinogen, UA: 1 mg/dL (ref 0.0–1.0)

## 2013-06-26 LAB — COMPREHENSIVE METABOLIC PANEL
ALT: 21 U/L (ref 0–35)
AST: 24 U/L (ref 0–37)
Albumin: 3.7 g/dL (ref 3.5–5.2)
Alkaline Phosphatase: 116 U/L (ref 39–117)
BILIRUBIN TOTAL: 0.6 mg/dL (ref 0.3–1.2)
BUN: 30 mg/dL — ABNORMAL HIGH (ref 6–23)
CALCIUM: 9.1 mg/dL (ref 8.4–10.5)
CHLORIDE: 84 meq/L — AB (ref 96–112)
CO2: 24 meq/L (ref 19–32)
Creatinine, Ser: 1.79 mg/dL — ABNORMAL HIGH (ref 0.50–1.10)
GFR calc non Af Amer: 27 mL/min — ABNORMAL LOW (ref >90)
GFR, EST AFRICAN AMERICAN: 31 mL/min — AB (ref >90)
Glucose, Bld: 565 mg/dL (ref 70–99)
Potassium: 6 mEq/L — ABNORMAL HIGH (ref 3.7–5.3)
SODIUM: 120 meq/L — AB (ref 137–147)
Total Protein: 7 g/dL (ref 6.0–8.3)

## 2013-06-26 LAB — CBG MONITORING, ED
GLUCOSE-CAPILLARY: 423 mg/dL — AB (ref 70–99)
Glucose-Capillary: 572 mg/dL (ref 70–99)

## 2013-06-26 LAB — GLUCOSE, CAPILLARY
GLUCOSE-CAPILLARY: 389 mg/dL — AB (ref 70–99)
GLUCOSE-CAPILLARY: 219 mg/dL — AB (ref 70–99)
GLUCOSE-CAPILLARY: 140 mg/dL — AB (ref 70–99)
GLUCOSE-CAPILLARY: 63 mg/dL — AB (ref 70–99)
GLUCOSE-CAPILLARY: 113 mg/dL — AB (ref 70–99)
Glucose-Capillary: 306 mg/dL — ABNORMAL HIGH (ref 70–99)

## 2013-06-26 MED ORDER — SODIUM CHLORIDE 0.9 % IV BOLUS (SEPSIS)
1000.0000 mL | Freq: Once | INTRAVENOUS | Status: AC
  Administered 2013-06-26: 1000 mL via INTRAVENOUS

## 2013-06-26 MED ORDER — SODIUM CHLORIDE 0.9 % IV SOLN
INTRAVENOUS | Status: DC
  Administered 2013-06-26 – 2013-06-27 (×2): 1000 mL via INTRAVENOUS
  Administered 2013-06-27 (×2): via INTRAVENOUS

## 2013-06-26 MED ORDER — SODIUM CHLORIDE 0.9 % IV BOLUS (SEPSIS): 1000.0000 mL | Freq: Once | INTRAVENOUS | Status: DC

## 2013-06-26 MED ORDER — AMLODIPINE BESYLATE 5 MG PO TABS
5.0000 mg | ORAL_TABLET | Freq: Every day | ORAL | Status: DC
  Administered 2013-06-27 – 2013-06-28 (×2): 5 mg via ORAL
  Filled 2013-06-26 (×2): qty 1

## 2013-06-26 MED ORDER — SODIUM CHLORIDE 0.9 % IV SOLN
INTRAVENOUS | Status: DC
  Administered 2013-06-26: 6.6 [IU]/h via INTRAVENOUS
  Administered 2013-06-26: 3.6 [IU]/h via INTRAVENOUS
  Administered 2013-06-26: 4.9 [IU]/h via INTRAVENOUS
  Filled 2013-06-26: qty 1

## 2013-06-26 MED ORDER — CYCLOSPORINE 0.05 % OP EMUL
1.0000 [drp] | Freq: Every evening | OPHTHALMIC | Status: DC
  Administered 2013-06-26 – 2013-06-30 (×5): 1 [drp] via OPHTHALMIC
  Filled 2013-06-26 (×6): qty 1

## 2013-06-26 MED ORDER — ATORVASTATIN CALCIUM 40 MG PO TABS
40.0000 mg | ORAL_TABLET | Freq: Every day | ORAL | Status: DC
  Administered 2013-06-27 – 2013-07-01 (×5): 40 mg via ORAL
  Filled 2013-06-26 (×5): qty 1

## 2013-06-26 MED ORDER — ONDANSETRON HCL 4 MG PO TABS: 4.0000 mg | ORAL_TABLET | Freq: Four times a day (QID) | ORAL | Status: DC | PRN

## 2013-06-26 MED ORDER — CYCLOSPORINE 0.05 % OP EMUL: 1.0000 [drp] | Freq: Two times a day (BID) | OPHTHALMIC | Status: DC

## 2013-06-26 MED ORDER — ONDANSETRON HCL 4 MG/2ML IJ SOLN
4.0000 mg | Freq: Four times a day (QID) | INTRAMUSCULAR | Status: DC | PRN
Start: 2013-06-26 — End: 2013-07-01

## 2013-06-26 MED ORDER — NEBIVOLOL HCL 10 MG PO TABS
10.0000 mg | ORAL_TABLET | Freq: Every day | ORAL | Status: DC
  Administered 2013-06-27 – 2013-07-01 (×5): 10 mg via ORAL
  Filled 2013-06-26 (×5): qty 1

## 2013-06-26 MED ORDER — ASPIRIN EC 81 MG PO TBEC
81.0000 mg | DELAYED_RELEASE_TABLET | Freq: Every day | ORAL | Status: DC
  Administered 2013-06-27: 81 mg via ORAL
  Filled 2013-06-26: qty 1

## 2013-06-26 MED ORDER — CYCLOSPORINE 0.05 % OP EMUL
2.0000 [drp] | Freq: Every morning | OPHTHALMIC | Status: DC
  Administered 2013-06-27 – 2013-07-01 (×5): 2 [drp] via OPHTHALMIC
  Filled 2013-06-26 (×5): qty 1

## 2013-06-26 MED ORDER — ACETAMINOPHEN 325 MG PO TABS: 650.0000 mg | ORAL_TABLET | Freq: Four times a day (QID) | ORAL | Status: DC | PRN

## 2013-06-26 MED ORDER — HEPARIN SODIUM (PORCINE) 5000 UNIT/ML IJ SOLN
5000.0000 [IU] | Freq: Three times a day (TID) | INTRAMUSCULAR | Status: DC
  Administered 2013-06-26 – 2013-06-27 (×3): 5000 [IU] via SUBCUTANEOUS
  Filled 2013-06-26 (×8): qty 1

## 2013-06-26 MED ORDER — ACETAMINOPHEN 650 MG RE SUPP: 650.0000 mg | Freq: Four times a day (QID) | RECTAL | Status: DC | PRN

## 2013-06-26 MED ORDER — DOCUSATE SODIUM 100 MG PO CAPS
100.0000 mg | ORAL_CAPSULE | Freq: Two times a day (BID) | ORAL | Status: DC
  Administered 2013-06-27 – 2013-06-30 (×8): 100 mg via ORAL
  Filled 2013-06-26 (×11): qty 1

## 2013-06-26 MED ORDER — ADULT MULTIVITAMIN W/MINERALS CH
1.0000 | ORAL_TABLET | Freq: Every day | ORAL | Status: DC
  Administered 2013-06-27 – 2013-07-01 (×5): 1 via ORAL
  Filled 2013-06-26 (×5): qty 1

## 2013-06-26 NOTE — ED Notes (Signed)
CRITICAL LAB RESULTS CALLED IN BY LAB  CRITICAL NA 120  CRITICAL GLUCOSE 565  Dr. Karma Ganja made aware. 1520

## 2013-06-26 NOTE — ED Notes (Signed)
Here for high glucose has been running > 500 took 15 units lantus at 0800

## 2013-06-26 NOTE — ED Notes (Signed)
Attempt to call report.

## 2013-06-26 NOTE — ED Notes (Signed)
Waiting on insulin drip from pharmacy

## 2013-06-26 NOTE — ED Provider Notes (Signed)
CSN: [id number]     Arrival date & time 06/26/13  1329 History   First MD Initiated Contact with Patient 06/26/13 1500     Chief Complaint  Patient presents with  . Hyperglycemia     (Consider location/radiation/quality/duration/timing/severity/associated sxs/prior Treatment) HPI Comments: Patient is a 73 year old female with history of hypertension, diabetes, high cholesterol who presents today with hyperglycemia. She reports that her blood sugars have been running greater than 500 since yesterday. She generally measures her glucose daily and it has never been this high. She has been taking her insulin and eating normally. She denies any recent illness, fever, chills, nausea, vomiting, abdominal pain, diarrhea, shortness of breath, chest pain, cough. She was diagnosed with diabetes for 15 years ago. She takes 100 units of Lantus in the morning before breakfast. This morning she ate sausage, grits, and eggs. This is normal for her. She has polyuria, but states this is due to her fluid pill. No polyphagia or polydipsia.  Patient is a 73 y.o. female presenting with hyperglycemia. The history is provided by the patient. No language interpreter was used.  Hyperglycemia Associated symptoms: polyuria   Associated symptoms: no abdominal pain, no chest pain, no fever, no increased thirst, no nausea, no shortness of breath and no vomiting     Past Medical History  Diagnosis Date  . Hypertension   . Diabetes mellitus   . High cholesterol   . Muscle cramps at night    History reviewed. No pertinent past surgical history. History reviewed. No pertinent family history. History  Substance Use Topics  . Smoking status: Current Every Day Smoker  . Smokeless tobacco: Not on file  . Alcohol Use: No   OB History   Grav Para Term Preterm Abortions TAB SAB Ect Mult Living                 Review of Systems  Constitutional: Negative for fever and chills.  Respiratory: Negative for shortness of  breath.   Cardiovascular: Negative for chest pain.  Gastrointestinal: Negative for nausea, vomiting and abdominal pain.  Endocrine: Positive for polyuria. Negative for polydipsia and polyphagia.  All other systems reviewed and are negative.     Allergies  Lisinopril  Home Medications   Prior to Admission medications   Medication Sig Start Date End Date Taking? Authorizing Provider  aspirin EC 81 MG tablet Take 81 mg by mouth every morning.     Historical Provider, MD  atorvastatin (LIPITOR) 40 MG tablet Take 40 mg by mouth every morning.     Historical Provider, MD  capsicum (ZOSTRIX) 0.075 % topical cream Apply 1 application topically daily as needed (Muscle pain).    Historical Provider, MD  insulin glargine (LANTUS) 100 UNIT/ML injection Inject 15 Units into the skin daily before breakfast.     Historical Provider, MD  losartan-hydrochlorothiazide (HYZAAR) 100-25 MG per tablet Take 1 tablet by mouth daily. 12/04/12   Rodolph BongEvan S Corey, MD  magnesium gluconate (MAGONATE) 500 MG tablet Take 500 mg by mouth every morning.    Historical Provider, MD  nebivolol (BYSTOLIC) 5 MG tablet Take 5 mg by mouth every morning.    Historical Provider, MD  traMADol (ULTRAM) 50 MG tablet Take 50 mg by mouth every 6 (six) hours as needed for moderate pain. 12/04/12   Rodolph BongEvan S Corey, MD   BP 153/62  Pulse 65  Temp(Src) 97.6 F (36.4 C) (Oral)  SpO2 100% Physical Exam  Nursing note and vitals reviewed. Constitutional: She is oriented  to person, place, and time. She appears well-developed and well-nourished. No distress.  frail  HENT:  Head: Normocephalic and atraumatic.  Right Ear: External ear normal.  Left Ear: External ear normal.  Nose: Nose normal.  Mouth/Throat: Oropharynx is clear and moist. Mucous membranes are dry.  Eyes: Conjunctivae are normal.  Neck: Normal range of motion.  Cardiovascular: Normal rate, regular rhythm and normal heart sounds.   Pulmonary/Chest: Effort normal and breath  sounds normal. No stridor. No respiratory distress. She has no wheezes. She has no rales.  Abdominal: Soft. She exhibits no distension.  Musculoskeletal: Normal range of motion.  Neurological: She is alert and oriented to person, place, and time. She has normal strength.  Skin: Skin is warm and dry. She is not diaphoretic. No erythema.  Psychiatric: She has a normal mood and affect. Her behavior is normal.    ED Course  Procedures (including critical care time) Labs Review Labs Reviewed  CBC - Abnormal; Notable for the following:    Hemoglobin 9.9 (*)    HCT 30.0 (*)    MCV 74.3 (*)    MCH 24.5 (*)    All other components within normal limits  COMPREHENSIVE METABOLIC PANEL - Abnormal; Notable for the following:    Sodium 120 (*)    Potassium 6.0 (*)    Chloride 84 (*)    Glucose, Bld 565 (*)    BUN 30 (*)    Creatinine, Ser 1.79 (*)    GFR calc non Af Amer 27 (*)    GFR calc Af Amer 31 (*)    All other components within normal limits  URINALYSIS, ROUTINE W REFLEX MICROSCOPIC - Abnormal; Notable for the following:    APPearance CLOUDY (*)    Glucose, UA >1000 (*)    Hgb urine dipstick TRACE (*)    Protein, ur 100 (*)    Leukocytes, UA MODERATE (*)    All other components within normal limits  URINE MICROSCOPIC-ADD ON - Abnormal; Notable for the following:    Bacteria, UA MANY (*)    All other components within normal limits  CBG MONITORING, ED - Abnormal; Notable for the following:    Glucose-Capillary 572 (*)    All other components within normal limits  CBG MONITORING, ED - Abnormal; Notable for the following:    Glucose-Capillary 423 (*)    All other components within normal limits  CBG MONITORING, ED    Imaging Review No results found.   EKG Interpretation   Date/Time:  Sunday June 26 2013 15:38:17 EDT Ventricular Rate:  67 PR Interval:  220 QRS Duration: 85 QT Interval:  444 QTC Calculation: 469 R Axis:   -28 Text Interpretation:  Sinus rhythm with  first degree AV block Probable  left atrial enlargement Borderline left axis deviation Since previous  tracing pr interval is prolonged Confirmed by Karma Ganja  MD, MARTHA [phone])  on 06/26/2013 3:50:24 PM      MDM   Final diagnoses:  Hyperglycemia  AKI (acute kidney injury)  Hyponatremia   Patient presents to ED with cc hyperglycemia. Patient found to be significantly hyponatremic, hyperkalemic, hypochloremic, and with new AKI. Patient also appears to have UTI. I consulted medicine for admission and stabilization of this patient. Admission is appreciated. Patient is hemodynamically stable at this time. Discussed case with Dr. Karma Ganja who agrees with plan. Patient / Family / Caregiver informed of clinical course, understand medical decision-making process, and agree with plan.     Mora Bellman, PA-C  06/26/13 1718 

## 2013-06-26 NOTE — ED Provider Notes (Signed)
Medical screening examination/treatment/procedure(s) were performed by non-physician practitioner and as supervising physician I was immediately available for consultation/collaboration.   EKG Interpretation   Date/Time:  Sunday June 26 2013 15:38:17 EDT Ventricular Rate:  67 PR Interval:  220 QRS Duration: 85 QT Interval:  444 QTC Calculation: 469 R Axis:   -28 Text Interpretation:  Sinus rhythm with first degree AV block Probable  left atrial enlargement Borderline left axis deviation Since previous  tracing pr interval is prolonged Confirmed by Karma Ganja  MD, Kynley Metzger [phone])  on 06/26/2013 3:50:24 PM       Ethelda Chick, MD 06/26/13 1718

## 2013-06-26 NOTE — H&P (Signed)
Referring Physician: Charolette Forward, MD.  Lisa Carpenter is an 73 y.o. female.                       Chief Complaint: High sugar and dizziness. HPI: 73 year old female with history of hypertension, diabetes, high cholesterol presents today with hyperglycemia and dizziness. She reports that her blood sugars have been running greater than 500 since yesterday. She generally measures her glucose daily and it has never been this high. She has been taking her insulin and eating normally. She denies any recent illness, fever, chills, nausea, vomiting, abdominal pain, diarrhea, shortness of breath, chest pain, cough. She was diagnosed with diabetes for 15 years ago. She takes 100 units of Lantus in the morning before breakfast. This morning she ate sausage, Rosiland Oz, eggs. This is normal for her. She has polyuria, but states this is due to her fluid pill. No abdominal pain, no chest pain, no fever, no increased thirst, no nausea, no shortness of breath and no vomiting.     Past Medical History  Diagnosis Date  . Hypertension   . Diabetes mellitus   . High cholesterol   . Muscle cramps at night       History reviewed. No pertinent past surgical history.  History reviewed. No pertinent family history. Social History:  reports that she has been smoking.  She does not have any smokeless tobacco history on file. She reports that she does not drink alcohol or use illicit drugs.  Allergies:  Allergies  Allergen Reactions  . Ivp Dye [Iodinated Diagnostic Agents] Other (See Comments)    Passed out - from intravenous dye  . Lisinopril Cough     (Not in a hospital admission)  Results for orders placed during the hospital encounter of 06/26/13 (from the past 48 hour(s))  CBG MONITORING, ED     Status: Abnormal   Collection Time    06/26/13  1:42 PM      Result Value Ref Range   Glucose-Capillary 572 (*) 70 - 99 mg/dL  CBC     Status: Abnormal   Collection Time    06/26/13  2:00 PM      Result  Value Ref Range   WBC 5.1  4.0 - 10.5 K/uL   RBC 4.04  3.87 - 5.11 MIL/uL   Hemoglobin 9.9 (*) 12.0 - 15.0 g/dL   HCT 30.0 (*) 36.0 - 46.0 %   MCV 74.3 (*) 78.0 - 100.0 fL   MCH 24.5 (*) 26.0 - 34.0 pg   MCHC 33.0  30.0 - 36.0 g/dL   RDW 13.5  11.5 - 15.5 %   Platelets 246  150 - 400 K/uL  COMPREHENSIVE METABOLIC PANEL     Status: Abnormal   Collection Time    06/26/13  2:00 PM      Result Value Ref Range   Sodium 120 (*) 137 - 147 mEq/L   Comment: CRITICAL RESULT CALLED TO, READ BACK BY AND VERIFIED WITH:     POULOSE E,RN 06/26/13 1520 WAYK   Potassium 6.0 (*) 3.7 - 5.3 mEq/L   Comment: NO VISIBLE HEMOLYSIS   Chloride 84 (*) 96 - 112 mEq/L   CO2 24  19 - 32 mEq/L   Glucose, Bld 565 (*) 70 - 99 mg/dL   Comment: CRITICAL RESULT CALLED TO, READ BACK BY AND VERIFIED WITH:     POULOSE E,RN 06/26/13 1520 WAYK   BUN 30 (*) 6 - 23 mg/dL  Creatinine, Ser 1.79 (*) 0.50 - 1.10 mg/dL   Calcium 9.1  8.4 - 10.5 mg/dL   Total Protein 7.0  6.0 - 8.3 g/dL   Albumin 3.7  3.5 - 5.2 g/dL   AST 24  0 - 37 U/L   ALT 21  0 - 35 U/L   Alkaline Phosphatase 116  39 - 117 U/L   Total Bilirubin 0.6  0.3 - 1.2 mg/dL   GFR calc non Af Amer 27 (*) >90 mL/min   GFR calc Af Amer 31 (*) >90 mL/min   Comment: (NOTE)     The eGFR has been calculated using the CKD EPI equation.     This calculation has not been validated in all clinical situations.     eGFR's persistently <90 mL/min signify possible Chronic Kidney     Disease.  URINALYSIS, ROUTINE W REFLEX MICROSCOPIC     Status: Abnormal   Collection Time    06/26/13  3:45 PM      Result Value Ref Range   Color, Urine YELLOW  YELLOW   APPearance CLOUDY (*) CLEAR   Specific Gravity, Urine 1.019  1.005 - 1.030   pH 6.0  5.0 - 8.0   Glucose, UA >1000 (*) NEGATIVE mg/dL   Hgb urine dipstick TRACE (*) NEGATIVE   Bilirubin Urine NEGATIVE  NEGATIVE   Ketones, ur NEGATIVE  NEGATIVE mg/dL   Protein, ur 100 (*) NEGATIVE mg/dL   Urobilinogen, UA 1.0  0.0 -  1.0 mg/dL   Nitrite NEGATIVE  NEGATIVE   Leukocytes, UA MODERATE (*) NEGATIVE   No results found.  Review Of Systems Constitutional: Negative for fever and chills.  Respiratory: Negative for shortness of breath.  Cardiovascular: Negative for chest pain.  Gastrointestinal: Negative for nausea, vomiting and abdominal pain.  Endocrine: Positive for polyuria. Negative for polydipsia and polyphagia.  All other systems reviewed and are negative.   Blood pressure 197/69, pulse 58, temperature 97.6 F (36.4 C), temperature source Oral, SpO2 100.00%.  Physical Exam  Nursing note and vitals reviewed.  Constitutional: She is oriented to person, place, and time. She appears averagely built and nourished. No distress.  HENT: Head: Normocephalic and atraumatic. Right Ear: External ear normal. Left Ear: External ear normal. Nose: Nose normal. Mouth/Throat: Oropharynx is clear. Tongue is dry.  Eyes: Owens Shark, EOMI, Conjunctivae are pale pink, Sclera-non-icteric.  Neck: Supple, normal range of motion.  Cardiovascular: Normal rate, regular rhythm and normal heart sounds.  Pulmonary/Chest: Effort normal and breath sounds normal. No stridor. No respiratory distress. She has no wheezes. She has no rales.  Abdominal: Soft. She exhibits no distension.  Musculoskeletal: Normal range of motion. No edema, cyanosis or clubbing. Neurological: She is alert and oriented to person, place, and time. She has normal strength.  Skin: Skin is warm and dry. She is not diaphoretic. No erythema.  Psychiatric: She has a normal mood and affect. Her behavior is normal.   Assessment/Plan Uncontrolled diabetes mellitus, II Dehydration due to above. Renal dysfunction due to dehydration Hyponatremia due to above. Hyper kalemia due to above Anemia, etiology unknown Hypertension Hyperlipidemia  Admit IV fluids IV insulin.  Birdie Riddle 06/26/2013, 4:19 PM

## 2013-06-27 DIAGNOSIS — I517 Cardiomegaly: Secondary | ICD-10-CM

## 2013-06-27 LAB — BASIC METABOLIC PANEL
BUN: 21 mg/dL (ref 6–23)
BUN: 22 mg/dL (ref 6–23)
CHLORIDE: 100 meq/L (ref 96–112)
CHLORIDE: 96 meq/L (ref 96–112)
CO2: 25 mEq/L (ref 19–32)
CO2: 26 mEq/L (ref 19–32)
Calcium: 8.8 mg/dL (ref 8.4–10.5)
Calcium: 9.2 mg/dL (ref 8.4–10.5)
Creatinine, Ser: 1.35 mg/dL — ABNORMAL HIGH (ref 0.50–1.10)
Creatinine, Ser: 1.37 mg/dL — ABNORMAL HIGH (ref 0.50–1.10)
GFR calc Af Amer: 44 mL/min — ABNORMAL LOW (ref >90)
GFR calc non Af Amer: 37 mL/min — ABNORMAL LOW (ref >90)
GFR, EST AFRICAN AMERICAN: 43 mL/min — AB (ref >90)
GFR, EST NON AFRICAN AMERICAN: 38 mL/min — AB (ref >90)
GLUCOSE: 132 mg/dL — AB (ref 70–99)
Glucose, Bld: 203 mg/dL — ABNORMAL HIGH (ref 70–99)
Potassium: 4.6 mEq/L (ref 3.7–5.3)
Potassium: 5.1 mEq/L (ref 3.7–5.3)
SODIUM: 135 meq/L — AB (ref 137–147)
Sodium: 133 mEq/L — ABNORMAL LOW (ref 137–147)

## 2013-06-27 LAB — GLUCOSE, CAPILLARY
GLUCOSE-CAPILLARY: 132 mg/dL — AB (ref 70–99)
GLUCOSE-CAPILLARY: 94 mg/dL (ref 70–99)
GLUCOSE-CAPILLARY: 304 mg/dL — AB (ref 70–99)
GLUCOSE-CAPILLARY: 265 mg/dL — AB (ref 70–99)
GLUCOSE-CAPILLARY: 11 mg/dL — AB (ref 70–99)
GLUCOSE-CAPILLARY: 98 mg/dL (ref 70–99)
Glucose-Capillary: 112 mg/dL — ABNORMAL HIGH (ref 70–99)
Glucose-Capillary: 169 mg/dL — ABNORMAL HIGH (ref 70–99)
Glucose-Capillary: 195 mg/dL — ABNORMAL HIGH (ref 70–99)
Glucose-Capillary: 217 mg/dL — ABNORMAL HIGH (ref 70–99)
Glucose-Capillary: 50 mg/dL — ABNORMAL LOW (ref 70–99)
Glucose-Capillary: 73 mg/dL (ref 70–99)

## 2013-06-27 LAB — CBC
HEMATOCRIT: 29.2 % — AB (ref 36.0–46.0)
Hemoglobin: 9.5 g/dL — ABNORMAL LOW (ref 12.0–15.0)
MCH: 24.2 pg — ABNORMAL LOW (ref 26.0–34.0)
MCHC: 32.5 g/dL (ref 30.0–36.0)
MCV: 74.5 fL — ABNORMAL LOW (ref 78.0–100.0)
Platelets: 211 10*3/uL (ref 150–400)
RBC: 3.92 MIL/uL (ref 3.87–5.11)
RDW: 13.5 % (ref 11.5–15.5)
WBC: 5.3 10*3/uL (ref 4.0–10.5)

## 2013-06-27 LAB — RETICULOCYTES
RBC.: 4.07 MIL/uL (ref 3.87–5.11)
Retic Count, Absolute: 61.1 10*3/uL (ref 19.0–186.0)
Retic Ct Pct: 1.5 % (ref 0.4–3.1)

## 2013-06-27 LAB — IRON AND TIBC
IRON: 97 ug/dL (ref 42–135)
Saturation Ratios: 38 % (ref 20–55)
TIBC: 257 ug/dL (ref 250–470)
UIBC: 160 ug/dL (ref 125–400)

## 2013-06-27 LAB — PROTIME-INR
INR: 0.87 (ref 0.00–1.49)
PROTHROMBIN TIME: 11.7 s (ref 11.6–15.2)

## 2013-06-27 LAB — MRSA PCR SCREENING: MRSA by PCR: NEGATIVE

## 2013-06-27 MED ORDER — INSULIN GLARGINE 100 UNIT/ML ~~LOC~~ SOLN
25.0000 [IU] | Freq: Every day | SUBCUTANEOUS | Status: DC
  Filled 2013-06-27 (×2): qty 0.25

## 2013-06-27 MED ORDER — PEG 3350-KCL-NA BICARB-NACL 420 G PO SOLR
4000.0000 mL | Freq: Once | ORAL | Status: AC
  Administered 2013-06-27: 4000 mL via ORAL
  Filled 2013-06-27: qty 4000

## 2013-06-27 MED ORDER — DEXTROSE 50 % IV SOLN
INTRAVENOUS | Status: AC
  Administered 2013-06-27: 20 mL
  Filled 2013-06-27: qty 50

## 2013-06-27 MED ORDER — INSULIN ASPART 100 UNIT/ML ~~LOC~~ SOLN
3.0000 [IU] | Freq: Three times a day (TID) | SUBCUTANEOUS | Status: DC
  Administered 2013-06-27 – 2013-07-01 (×4): 3 [IU] via SUBCUTANEOUS

## 2013-06-27 MED ORDER — INSULIN GLARGINE 100 UNIT/ML ~~LOC~~ SOLN
40.0000 [IU] | Freq: Every day | SUBCUTANEOUS | Status: DC
  Filled 2013-06-27: qty 0.4

## 2013-06-27 MED ORDER — INSULIN ASPART 100 UNIT/ML ~~LOC~~ SOLN
0.0000 [IU] | Freq: Three times a day (TID) | SUBCUTANEOUS | Status: DC
  Administered 2013-06-27: 8 [IU] via SUBCUTANEOUS
  Administered 2013-06-27: 11 [IU] via SUBCUTANEOUS
  Administered 2013-06-28: 8 [IU] via SUBCUTANEOUS
  Administered 2013-06-29: 2 [IU] via SUBCUTANEOUS
  Administered 2013-06-29: 3 [IU] via SUBCUTANEOUS
  Administered 2013-06-30: 15 [IU] via SUBCUTANEOUS

## 2013-06-27 NOTE — Progress Notes (Signed)
Hypoglycemic Event  CBG: 50  Treatment: 54ml D50 given  Symptoms: asymptomatic  Follow-up CBG: Time: 0635 CBG Result: 132  Possible Reasons for Event: Pt on glucostabilizer  Comments/MD notified: MD notified. Anion gap is 9. Orders to dc stabilizer at this time.     Drue Flirt  Remember to initiate Hypoglycemia Order Set & complete

## 2013-06-27 NOTE — Progress Notes (Signed)
Dr. Loreta Ave called and ordered to d/c Aspirin and Heparin for tomorrow's scheduled dose.  Patient for for EGD.  Golytely started, consent obtained.  Patient knew about EGD.  She had EGD done in the past per patient.

## 2013-06-27 NOTE — Progress Notes (Signed)
Inpatient Diabetes Program Recommendations  AACE/ADA: New Consensus Statement on Inpatient Glycemic Control (2013)  Target Ranges:  Prepandial:   less than 140 mg/dL      Peak postprandial:   less than 180 mg/dL (1-2 hours)      Critically ill patients:  140 - 180 mg/dL   Reason for Visit: Clarification of home Lantus.  Admitted with diabetes out of control.  Diabetes history: Type 2 diabetes Outpatient Diabetes medications: Lantus 15 units daily.   Current orders for Inpatient glycemic control: Lantus 25 units, Novolog moderate correction, Novolog meal coverage 3 units tid with meals.  Note:  Visited patient at bedside.  Seems to have poor insight into self-management of diabetes.  Talked of being found in floor of her apartment-- not sure if from high or low blood glucose.  Prior to this admission patient aware glucose very high-- even went to double-check over at neighbor's-- had Novolog to use for elevated glucose, but did not use.  Patient states that her "doctor has signed the papers that I need someone to stay with me at home, but I haven't found anyone yet".  Request MD consider ordering Home Health Nurse at discharge to assess and instruct/review regarding appropriate diabetes self-care. Thank you.  Lisa Henkes S. Elsie Lincoln, RN, CNS, CDE Inpatient Diabetes Program, team pager [phone]

## 2013-06-27 NOTE — Care Management Note (Addendum)
    Page 1 of 1   06/30/2013     2:01:03 PM CARE MANAGEMENT NOTE 06/30/2013  Patient:  Lisa Carpenter, Lisa Carpenter   Account Number:  [id number]  Date Initiated:  06/27/2013  Documentation initiated by:  Lisa Carpenter  Subjective/Objective Assessment:   adm w hyperglycemia     Action/Plan:   lives alone, pcp dr Lisa Carpenter   Choice offered to / List presented to:  C-1 Patient     HH arranged  HH-1 RN  HH-10 DISEASE MANAGEMENT      HH agency  [facility]   Medicare Important Message given?  YES Date Medicare IM given:  06/30/2013  Per UR Regulation:  Reviewed for med. necessity/level of care/duration of stay  Comments:  06/30/13 1200 Lisa Pfenning RN MSN BSN CCM Received request from diabetic educator to arrange home health RN for DM mgmt.  Provided list of agencies to pt, referral made per choice.

## 2013-06-27 NOTE — Consult Note (Signed)
Reason for Consult: Anemia. Referring Physician: Dr. Charolette Forward  Lisa Carpenter is an 73 y.o. female.  HPI: 73 year old black female, with multiple medical problems listed below, ewho had a colonoscopy done by me in 1997, was seen in the office for  Her 10 year colonoscopy but never scheduled her procedure inspite of several reminders, presents with a history of weakness and dizziness. Found to have a 2 gm drop in her hemoglobin in a few weeks. She denies having any melena or hematochezia,abdominal pain, nausea, vomiting, diarrhea or constipation. She denies the use of NSAIDS as well except for the Aspirin 81 mg that she takes [except for 1 Aleve she took 3 days ago].  Past Medical History  Diagnosis Date  . Hypertension   . Diabetes mellitus   . High cholesterol   . Muscle cramps at night    History reviewed. No pertinent past surgical history.  History reviewed. No pertinent family history.  Social History:  reports that she has been smoking.  She does not have any smokeless tobacco history on file. She reports that she does not drink alcohol or use illicit drugs.  Allergies:  Allergies  Allergen Reactions  . Ivp Dye [Iodinated Diagnostic Agents] Other (See Comments)    Passed out - from intravenous dye  . Lisinopril Cough   Medications: I have reviewed the patient's current medications.  Results for orders placed during the hospital encounter of 06/26/13 (from the past 48 hour(s))  BASIC METABOLIC PANEL     Status: Abnormal   Collection Time    06/26/13 12:35 AM      Result Value Ref Range   Sodium 133 (*) 137 - 147 mEq/L   Potassium 5.1  3.7 - 5.3 mEq/L   Chloride 96  96 - 112 mEq/L   CO2 25  19 - 32 mEq/L   Glucose, Bld 203 (*) 70 - 99 mg/dL   BUN 22  6 - 23 mg/dL   Creatinine, Ser 1.37 (*) 0.50 - 1.10 mg/dL   Calcium 9.2  8.4 - 10.5 mg/dL   GFR calc non Af Amer 37 (*) >90 mL/min   GFR calc Af Amer 43 (*) >90 mL/min   Comment: (NOTE)     The eGFR has been  calculated using the CKD EPI equation.     This calculation has not been validated in all clinical situations.     eGFR's persistently <90 mL/min signify possible Chronic Kidney     Disease.  CBG MONITORING, ED     Status: Abnormal   Collection Time    06/26/13  1:42 PM      Result Value Ref Range   Glucose-Capillary 572 (*) 70 - 99 mg/dL  CBC     Status: Abnormal   Collection Time    06/26/13  2:00 PM      Result Value Ref Range   WBC 5.1  4.0 - 10.5 K/uL   RBC 4.04  3.87 - 5.11 MIL/uL   Hemoglobin 9.9 (*) 12.0 - 15.0 g/dL   HCT 30.0 (*) 36.0 - 46.0 %   MCV 74.3 (*) 78.0 - 100.0 fL   MCH 24.5 (*) 26.0 - 34.0 pg   MCHC 33.0  30.0 - 36.0 g/dL   RDW 13.5  11.5 - 15.5 %   Platelets 246  150 - 400 K/uL  COMPREHENSIVE METABOLIC PANEL     Status: Abnormal   Collection Time    06/26/13  2:00 PM  Result Value Ref Range   Sodium 120 (*) 137 - 147 mEq/L   Comment: CRITICAL RESULT CALLED TO, READ BACK BY AND VERIFIED WITH:     POULOSE E,RN 06/26/13 1520 WAYK   Potassium 6.0 (*) 3.7 - 5.3 mEq/L   Comment: NO VISIBLE HEMOLYSIS   Chloride 84 (*) 96 - 112 mEq/L   CO2 24  19 - 32 mEq/L   Glucose, Bld 565 (*) 70 - 99 mg/dL   Comment: CRITICAL RESULT CALLED TO, READ BACK BY AND VERIFIED WITH:     POULOSE E,RN 06/26/13 1520 WAYK   BUN 30 (*) 6 - 23 mg/dL   Creatinine, Ser 1.79 (*) 0.50 - 1.10 mg/dL   Calcium 9.1  8.4 - 10.5 mg/dL   Total Protein 7.0  6.0 - 8.3 g/dL   Albumin 3.7  3.5 - 5.2 g/dL   AST 24  0 - 37 U/L   ALT 21  0 - 35 U/L   Alkaline Phosphatase 116  39 - 117 U/L   Total Bilirubin 0.6  0.3 - 1.2 mg/dL   GFR calc non Af Amer 27 (*) >90 mL/min   GFR calc Af Amer 31 (*) >90 mL/min   Comment: (NOTE)     The eGFR has been calculated using the CKD EPI equation.     This calculation has not been validated in all clinical situations.     eGFR's persistently <90 mL/min signify possible Chronic Kidney     Disease.  URINALYSIS, ROUTINE W REFLEX MICROSCOPIC     Status: Abnormal    Collection Time    06/26/13  3:45 PM      Result Value Ref Range   Color, Urine YELLOW  YELLOW   APPearance CLOUDY (*) CLEAR   Specific Gravity, Urine 1.019  1.005 - 1.030   pH 6.0  5.0 - 8.0   Glucose, UA >1000 (*) NEGATIVE mg/dL   Hgb urine dipstick TRACE (*) NEGATIVE   Bilirubin Urine NEGATIVE  NEGATIVE   Ketones, ur NEGATIVE  NEGATIVE mg/dL   Protein, ur 100 (*) NEGATIVE mg/dL   Urobilinogen, UA 1.0  0.0 - 1.0 mg/dL   Nitrite NEGATIVE  NEGATIVE   Leukocytes, UA MODERATE (*) NEGATIVE  URINE MICROSCOPIC-ADD ON     Status: Abnormal   Collection Time    06/26/13  3:45 PM      Result Value Ref Range   Squamous Epithelial / LPF RARE  RARE   WBC, UA 21-50  <3 WBC/hpf   RBC / HPF 0-2  <3 RBC/hpf   Bacteria, UA MANY (*) RARE  CBG MONITORING, ED     Status: Abnormal   Collection Time    06/26/13  4:50 PM      Result Value Ref Range   Glucose-Capillary 423 (*) 70 - 99 mg/dL   Comment 1 Notify RN    GLUCOSE, CAPILLARY     Status: Abnormal   Collection Time    06/26/13  6:05 PM      Result Value Ref Range   Glucose-Capillary 389 (*) 70 - 99 mg/dL  GLUCOSE, CAPILLARY     Status: Abnormal   Collection Time    06/26/13  7:09 PM      Result Value Ref Range   Glucose-Capillary 306 (*) 70 - 99 mg/dL  GLUCOSE, CAPILLARY     Status: Abnormal   Collection Time    06/26/13  8:14 PM      Result Value Ref Range   Glucose-Capillary 219 (*)  70 - 99 mg/dL  GLUCOSE, CAPILLARY     Status: Abnormal   Collection Time    06/26/13  9:12 PM      Result Value Ref Range   Glucose-Capillary 140 (*) 70 - 99 mg/dL  GLUCOSE, CAPILLARY     Status: Abnormal   Collection Time    06/26/13 10:16 PM      Result Value Ref Range   Glucose-Capillary 63 (*) 70 - 99 mg/dL  GLUCOSE, CAPILLARY     Status: Abnormal   Collection Time    06/26/13 10:45 PM      Result Value Ref Range   Glucose-Capillary 113 (*) 70 - 99 mg/dL  MRSA PCR SCREENING     Status: None   Collection Time    06/26/13 11:43 PM       Result Value Ref Range   MRSA by PCR NEGATIVE  NEGATIVE   Comment:            The GeneXpert MRSA Assay (FDA     approved for NASAL specimens     only), is one component of a     comprehensive MRSA colonization     surveillance program. It is not     intended to diagnose MRSA     infection nor to guide or     monitor treatment for     MRSA infections.  GLUCOSE, CAPILLARY     Status: Abnormal   Collection Time    06/27/13 12:24 AM      Result Value Ref Range   Glucose-Capillary 195 (*) 70 - 99 mg/dL  GLUCOSE, CAPILLARY     Status: Abnormal   Collection Time    06/27/13  1:36 AM      Result Value Ref Range   Glucose-Capillary 217 (*) 70 - 99 mg/dL  GLUCOSE, CAPILLARY     Status: Abnormal   Collection Time    06/27/13  2:46 AM      Result Value Ref Range   Glucose-Capillary 169 (*) 70 - 99 mg/dL  BASIC METABOLIC PANEL     Status: Abnormal   Collection Time    06/27/13  3:25 AM      Result Value Ref Range   Sodium 135 (*) 137 - 147 mEq/L   Comment: DELTA CHECK NOTED   Potassium 4.6  3.7 - 5.3 mEq/L   Comment: DELTA CHECK NOTED   Chloride 100  96 - 112 mEq/L   Comment: DELTA CHECK NOTED   CO2 26  19 - 32 mEq/L   Glucose, Bld 132 (*) 70 - 99 mg/dL   BUN 21  6 - 23 mg/dL   Creatinine, Ser 1.35 (*) 0.50 - 1.10 mg/dL   Calcium 8.8  8.4 - 10.5 mg/dL   GFR calc non Af Amer 38 (*) >90 mL/min   GFR calc Af Amer 44 (*) >90 mL/min   Comment: (NOTE)     The eGFR has been calculated using the CKD EPI equation.     This calculation has not been validated in all clinical situations.     eGFR's persistently <90 mL/min signify possible Chronic Kidney     Disease.  CBC     Status: Abnormal   Collection Time    06/27/13  3:25 AM      Result Value Ref Range   WBC 5.3  4.0 - 10.5 K/uL   RBC 3.92  3.87 - 5.11 MIL/uL   Hemoglobin 9.5 (*) 12.0 - 15.0 g/dL   HCT  29.2 (*) 36.0 - 46.0 %   MCV 74.5 (*) 78.0 - 100.0 fL   MCH 24.2 (*) 26.0 - 34.0 pg   MCHC 32.5  30.0 - 36.0 g/dL   RDW 13.5   11.5 - 15.5 %   Platelets 211  150 - 400 K/uL  PROTIME-INR     Status: None   Collection Time    06/27/13  3:25 AM      Result Value Ref Range   Prothrombin Time 11.7  11.6 - 15.2 seconds   INR 0.87  0.00 - 1.49  GLUCOSE, CAPILLARY     Status: Abnormal   Collection Time    06/27/13  3:49 AM      Result Value Ref Range   Glucose-Capillary 112 (*) 70 - 99 mg/dL  GLUCOSE, CAPILLARY     Status: None   Collection Time    06/27/13  4:53 AM      Result Value Ref Range   Glucose-Capillary 73  70 - 99 mg/dL  GLUCOSE, CAPILLARY     Status: Abnormal   Collection Time    06/27/13  6:19 AM      Result Value Ref Range   Glucose-Capillary 50 (*) 70 - 99 mg/dL  GLUCOSE, CAPILLARY     Status: Abnormal   Collection Time    06/27/13  6:36 AM      Result Value Ref Range   Glucose-Capillary 132 (*) 70 - 99 mg/dL  GLUCOSE, CAPILLARY     Status: None   Collection Time    06/27/13  8:13 AM      Result Value Ref Range   Glucose-Capillary 94  70 - 99 mg/dL  GLUCOSE, CAPILLARY     Status: Abnormal   Collection Time    06/27/13 11:55 AM      Result Value Ref Range   Glucose-Capillary 304 (*) 70 - 99 mg/dL  RETICULOCYTES     Status: None   Collection Time    06/27/13 12:32 PM      Result Value Ref Range   Retic Ct Pct 1.5  0.4 - 3.1 %   RBC. 4.07  3.87 - 5.11 MIL/uL   Retic Count, Manual 61.1  19.0 - 186.0 K/uL  GLUCOSE, CAPILLARY     Status: Abnormal   Collection Time    06/27/13  4:03 PM      Result Value Ref Range   Glucose-Capillary 265 (*) 70 - 99 mg/dL   No results found.  Review of Systems  Constitutional: Positive for malaise/fatigue. Negative for fever, chills and weight loss.  HENT: Negative.   Eyes: Negative.   Respiratory: Positive for shortness of breath.   Cardiovascular: Negative.   Gastrointestinal: Negative for heartburn, nausea, vomiting, abdominal pain, diarrhea, constipation, blood in stool and melena.  Genitourinary: Negative.   Skin: Negative.   Neurological:  Positive for weakness.  Endo/Heme/Allergies: Negative.   Psychiatric/Behavioral: Negative.    Blood pressure 153/60, pulse 56, temperature 98.2 F (36.8 C), temperature source Oral, resp. rate 14, height '5\' 3"'  (1.6 m), weight 54 kg (119 lb 0.8 oz), SpO2 100.00%. Physical Exam  Constitutional: She is oriented to person, place, and time. She appears well-developed and well-nourished.  HENT:  Head: Normocephalic and atraumatic.  Eyes: Conjunctivae and EOM are normal. Pupils are equal, round, and reactive to light.  Neck: Normal range of motion. Neck supple.  Cardiovascular: Normal rate and regular rhythm.   Respiratory: Effort normal and breath sounds normal.  GI: Soft.  Musculoskeletal:  Normal range of motion.  Neurological: She is alert and oriented to person, place, and time.  Skin: Skin is warm and dry.  Psychiatric: She has a normal mood and affect. Her behavior is normal. Judgment and thought content normal.   Assessment/Plan: 1) Iron deficiency anemia:will prep for an EGD/Colonoscopy scheduled for tomorrow. Will hold Heparin for the procedures.  2) HTN.  3) AODM.  4) Hyperkalemia-needs close monitoring.Juanita Craver 06/27/2013, 5:26 PM

## 2013-06-27 NOTE — Progress Notes (Signed)
*  PRELIMINARY RESULTS* Echocardiogram 2D Echocardiogram has been performed.  Lisa Carpenter 06/27/2013, 2:34 PM

## 2013-06-27 NOTE — Progress Notes (Signed)
Subjective:  Patient denies any chest pain or shortness of breath. Denies any abdominal pain.. Denies any bright red blood per rectum or tarry stools. Noted to have significantly drop in hemoglobin in last 6 months. States had GI evaluation many years ago.  Objective:  Vital Signs in the last 24 hours: Temp:  [97.3 F (36.3 C)-98.1 F (36.7 C)] 98.1 F (36.7 C) (06/08 0700) Pulse Rate:  [51-66] 61 (06/08 0700) Resp:  [11-22] 11 (06/08 0700) BP: (123-214)/(53-88) 189/77 mmHg (06/08 1008) SpO2:  [99 %-100 %] 100 % (06/08 0700) Weight:  [51.6 kg (113 lb 12.1 oz)-54 kg (119 lb 0.8 oz)] 54 kg (119 lb 0.8 oz) (06/08 0400)  Intake/Output from previous day: 06/07 0701 - 06/08 0700 In: 3367.6 [I.V.:3367.6] Out: 800 [Urine:800] Intake/Output from this shift: Total I/O In: 100 [I.V.:100] Out: 300 [Urine:300]  Physical Exam: Neck: no adenopathy, no carotid bruit, no JVD and supple, symmetrical, trachea midline Lungs: clear to auscultation bilaterally Heart: regular rate and rhythm, S1, S2 normal, no murmur, click, rub or gallop Abdomen: soft, non-tender; bowel sounds normal; no masses,  no organomegaly  Lab Results:  Recent Labs  06/26/13 1400 06/27/13 0325  WBC 5.1 5.3  HGB 9.9* 9.5*  PLT 246 211    Recent Labs  06/26/13 1400 06/27/13 0325  NA 120* 135*  K 6.0* 4.6  CL 84* 100  CO2 24 26  GLUCOSE 565* 132*  BUN 30* 21  CREATININE 1.79* 1.35*   No results found for this basename: TROPONINI, CK, MB,  in the last 72 hours Hepatic Function Panel  Recent Labs  06/26/13 1400  PROT 7.0  ALBUMIN 3.7  AST 24  ALT 21  ALKPHOS 116  BILITOT 0.6   No results found for this basename: CHOL,  in the last 72 hours No results found for this basename: PROTIME,  in the last 72 hours  Imaging: Imaging results have been reviewed and No results found.  Cardiac Studies:  Assessment/Plan:   status post uncontrolled diabetes mellitus Hypertension Hyperlipidemia Status post  hyponatremia Chronic kidney disease stage II improved Status post dehydration Hypochromic microcytic anemia rule out GI loss Status post dehydration Plan Check CBC in a.m. Check stool for occult blood Check anemia panel GI consult Ad just insulin as per orders   LOS: 1 day    Robynn Pane 06/27/2013, 11:28 AM

## 2013-06-28 ENCOUNTER — Encounter (HOSPITAL_COMMUNITY)
Admission: EM | Disposition: A | Discharge status: Home Health | Payer: Medicare Other | Source: Home / Self Care | Attending: Cardiology

## 2013-06-28 ENCOUNTER — Encounter (HOSPITAL_COMMUNITY): Payer: Self-pay

## 2013-06-28 LAB — FOLATE

## 2013-06-28 LAB — CBC
HCT: 28.3 % — ABNORMAL LOW (ref 36.0–46.0)
Hemoglobin: 9.3 g/dL — ABNORMAL LOW (ref 12.0–15.0)
MCH: 24.7 pg — ABNORMAL LOW (ref 26.0–34.0)
MCHC: 32.9 g/dL (ref 30.0–36.0)
MCV: 75.1 fL — ABNORMAL LOW (ref 78.0–100.0)
PLATELETS: 227 10*3/uL (ref 150–400)
RBC: 3.77 MIL/uL — ABNORMAL LOW (ref 3.87–5.11)
RDW: 13.6 % (ref 11.5–15.5)
WBC: 4.7 10*3/uL (ref 4.0–10.5)

## 2013-06-28 LAB — GLUCOSE, CAPILLARY
GLUCOSE-CAPILLARY: 247 mg/dL — AB (ref 70–99)
GLUCOSE-CAPILLARY: 301 mg/dL — AB (ref 70–99)
GLUCOSE-CAPILLARY: 299 mg/dL — AB (ref 70–99)
GLUCOSE-CAPILLARY: 172 mg/dL — AB (ref 70–99)
Glucose-Capillary: 139 mg/dL — ABNORMAL HIGH (ref 70–99)
Glucose-Capillary: 545 mg/dL — ABNORMAL HIGH (ref 70–99)
Glucose-Capillary: 55 mg/dL — ABNORMAL LOW (ref 70–99)

## 2013-06-28 LAB — BASIC METABOLIC PANEL
BUN: 9 mg/dL (ref 6–23)
BUN: 8 mg/dL (ref 6–23)
CALCIUM: 9.2 mg/dL (ref 8.4–10.5)
CHLORIDE: 99 meq/L (ref 96–112)
CHLORIDE: 97 meq/L (ref 96–112)
CO2: 23 mEq/L (ref 19–32)
CO2: 23 mEq/L (ref 19–32)
CREATININE: 0.98 mg/dL (ref 0.50–1.10)
Calcium: 8.8 mg/dL (ref 8.4–10.5)
Creatinine, Ser: 1.03 mg/dL (ref 0.50–1.10)
GFR calc Af Amer: 65 mL/min — ABNORMAL LOW (ref >90)
GFR calc non Af Amer: 56 mL/min — ABNORMAL LOW (ref >90)
GFR calc non Af Amer: 53 mL/min — ABNORMAL LOW (ref >90)
GFR, EST AFRICAN AMERICAN: 61 mL/min — AB (ref >90)
Glucose, Bld: 174 mg/dL — ABNORMAL HIGH (ref 70–99)
Glucose, Bld: 374 mg/dL — ABNORMAL HIGH (ref 70–99)
POTASSIUM: 5 meq/L (ref 3.7–5.3)
Potassium: 4.5 mEq/L (ref 3.7–5.3)
SODIUM: 131 meq/L — AB (ref 137–147)
Sodium: 133 mEq/L — ABNORMAL LOW (ref 137–147)

## 2013-06-28 LAB — VITAMIN B12: Vitamin B-12: 731 pg/mL (ref 211–911)

## 2013-06-28 LAB — HEMOGLOBIN A1C
HEMOGLOBIN A1C: 10.4 % — AB (ref <5.7)
MEAN PLASMA GLUCOSE: 252 mg/dL — AB (ref <117)

## 2013-06-28 LAB — FERRITIN: Ferritin: 225 ng/mL (ref 10–291)

## 2013-06-28 SURGERY — CANCELLED PROCEDURE

## 2013-06-28 MED ORDER — DEXTROSE 50 % IV SOLN
INTRAVENOUS | Status: AC
  Administered 2013-06-28: 50 mL
  Filled 2013-06-28: qty 50

## 2013-06-28 MED ORDER — INSULIN ASPART 100 UNIT/ML ~~LOC~~ SOLN
15.0000 [IU] | Freq: Once | SUBCUTANEOUS | Status: AC
  Administered 2013-06-28: 15 [IU] via SUBCUTANEOUS

## 2013-06-28 MED ORDER — INSULIN GLARGINE 100 UNIT/ML ~~LOC~~ SOLN
20.0000 [IU] | Freq: Once | SUBCUTANEOUS | Status: AC
  Administered 2013-06-28: 20 [IU] via SUBCUTANEOUS
  Filled 2013-06-28: qty 0.2

## 2013-06-28 MED ORDER — AMLODIPINE BESYLATE 10 MG PO TABS
10.0000 mg | ORAL_TABLET | Freq: Every day | ORAL | Status: DC
  Administered 2013-06-29 – 2013-07-01 (×3): 10 mg via ORAL
  Filled 2013-06-28 (×3): qty 1

## 2013-06-28 MED ORDER — POLYETHYLENE GLYCOL 3350 17 GM/SCOOP PO POWD
1.0000 | Freq: Once | ORAL | Status: AC
  Administered 2013-06-28: 1 via ORAL
  Filled 2013-06-28: qty 255

## 2013-06-28 MED ORDER — DEXTROSE-NACL 5-0.45 % IV SOLN
INTRAVENOUS | Status: DC
  Administered 2013-06-28 – 2013-06-30 (×4): via INTRAVENOUS

## 2013-06-28 MED ORDER — INSULIN GLARGINE 100 UNIT/ML ~~LOC~~ SOLN
20.0000 [IU] | Freq: Every morning | SUBCUTANEOUS | Status: DC
  Administered 2013-06-29 – 2013-07-01 (×3): 20 [IU] via SUBCUTANEOUS
  Filled 2013-06-28 (×3): qty 0.2

## 2013-06-28 NOTE — H&P (Addendum)
Nutrition Brief Note  Patient identified on the Malnutrition Screening Tool (MST) Report for recent weight loss without trying (pt unsure).  Patient reports her weight has been stable.  Wt Readings from Last 15 Encounters:  06/28/13 121 lb 0.5 oz (54.9 kg)  06/28/13 121 lb 0.5 oz (54.9 kg)    Body mass index is 21.45 kg/(m^2). Patient meets criteria for Normal based on current BMI.   Patient reports she's hungry.  Current diet order is Clear Liquids. Labs and medications reviewed.   No nutrition interventions warranted at this time. If nutrition issues arise, please consult RD.   Maureen Chatters, RD, LDN Pager #: [phone] After-Hours Pager #: [phone]

## 2013-06-28 NOTE — Progress Notes (Signed)
Inpatient Diabetes Program Recommendations  AACE/ADA: New Consensus Statement on Inpatient Glycemic Control (2013)  Target Ranges:  Prepandial:   less than 140 mg/dL      Peak postprandial:   less than 180 mg/dL (1-2 hours)      Critically ill patients:  140 - 180 mg/dL   Reason for Assessment: Hypoglycemia  Diabetes history: Type 2 diabetes  Outpatient Diabetes medications: Lantus 15 units daily.  Current orders for Inpatient glycemic control: Lantus 25 units, Novolog moderate correction, Novolog meal coverage 3 units tid with meals.  Results for ALANNYS, PUNZEL (MRN [id number]) as of 06/28/2013 08:52  Ref. Range 06/27/2013 08:13 06/27/2013 11:55 06/27/2013 16:03 06/27/2013 21:41 06/27/2013 22:13 06/28/2013 00:32 06/28/2013 01:04 06/28/2013 05:09  Glucose-Capillary Latest Range: 70-99 mg/dL 94 335 (H) 456 (H) 11 (LL) 98 55 (L) 139 (H) 247 (H)   Note: When patient was transitoned off insulin drip yesterday, did not receive any basal insulin-- basal insulin was scheduled to start last night.  Received two large doses of Novolog at lunch and supper- documented as eating 10% of lunch.  At [facility] became severely hypoglycemic.  Did not receive Lantus last night due to severe hypoglycemia.  Patient scheduled for EGD and Colonoscopy today.  At 5 am CBG 247 mg/dl.   Request MD consider the following:  Change timing of Lantus insulin to morning and begin today  Change Lantus dosage to home dose of 15 units and titrate upward as indicated  Decrease correction to sensitive  Continue meal coverage provided patient eats at least 50% of meal  Thank you.  Ahmari Garton S. Elsie Lincoln, RN, CNS, CDE Inpatient Diabetes Program, team pager [phone]

## 2013-06-28 NOTE — Progress Notes (Signed)
Subjective: No complaints.  Objective: Vital signs in last 24 hours: Temp:  [97.4 F (36.3 C)-98.2 F (36.8 C)] 97.9 F (36.6 C) (06/09 1221) Pulse Rate:  [56-88] 73 (06/09 0800) Resp:  [14-26] 16 (06/09 1221) BP: (153-205)/(52-76) 205/72 mmHg (06/09 1221) SpO2:  [99 %-100 %] 100 % (06/09 1221) Weight:  [121 lb 0.5 oz (54.9 kg)] 121 lb 0.5 oz (54.9 kg) (06/09 0400) Last BM Date: 06/28/13  Intake/Output from previous day: 06/08 0701 - 06/09 0700 In: 2936.3 [P.O.:1320; I.V.:1616.3] Out: 1750 [Urine:1750] Intake/Output this shift: Total I/O In: 75 [I.V.:75] Out: -   General appearance: alert and no distress GI: soft, non-tender; bowel sounds normal; no masses,  no organomegaly  Lab Results:  Recent Labs  06/26/13 1400 06/27/13 0325 06/28/13 0247  WBC 5.1 5.3 4.7  HGB 9.9* 9.5* 9.3*  HCT 30.0* 29.2* 28.3*  PLT 246 211 227   BMET  Recent Labs  06/26/13 1400 06/27/13 0325 06/28/13 0247  NA 120* 135* 133*  K 6.0* 4.6 5.0  CL 84* 100 99  CO2 24 26 23   GLUCOSE 565* 132* 174*  BUN 30* 21 9  CREATININE 1.79* 1.35* 0.98  CALCIUM 9.1 8.8 8.8   LFT  Recent Labs  06/26/13 1400  PROT 7.0  ALBUMIN 3.7  AST 24  ALT 21  ALKPHOS 116  BILITOT 0.6   PT/INR  Recent Labs  06/27/13 0325  LABPROT 11.7  INR 0.87   Hepatitis Panel No results found for this basename: HEPBSAG, HCVAB, HEPAIGM, HEPBIGM,  in the last 72 hours C-Diff No results found for this basename: CDIFFTOX,  in the last 72 hours Fecal Lactopherrin No results found for this basename: FECLLACTOFRN,  in the last 72 hours  Studies/Results: No results found.  Medications:  Scheduled: . [MAR HOLD] amLODipine  10 mg Oral Daily  . [facility] HOLD] aspirin EC  81 mg Oral Daily  . [facility] HOLD] atorvastatin  40 mg Oral Daily  . [MAR HOLD] cycloSPORINE  1 drop Both Eyes QPM  . [MAR HOLD] cycloSPORINE  2 drop Both Eyes q morning - 10a  . [facility] HOLD] docusate sodium  100 mg Oral BID  . [MAR HOLD] heparin   5,000 Units Subcutaneous 3 times per day  . [MAR HOLD] insulin aspart  0-15 Units Subcutaneous TID WC  . [MAR HOLD] insulin aspart  3 Units Subcutaneous TID WC  . [MAR HOLD] insulin glargine  20 Units Subcutaneous q morning - 10a  . [MAR HOLD] multivitamin with minerals  1 tablet Oral Daily  . [MAR HOLD] nebivolol  10 mg Oral Daily  . [facility] HOLD] sodium chloride  1,000 mL Intravenous Once   Continuous: . dextrose 5 % and 0.45% NaCl 75 mL/hr at 06/28/13 0127    Assessment/Plan: 1) Iron Deficiency Anemia. 2) Uncontrolled DM.   The patient was brought down to endoscopy to undergo an EGD.  She did not adequately prep for the colonoscopy.  Just prior to the procedure she was identified to have a blood sugar of >500.  As a result I cancelled the procedure.  Plan: 1) Closer blood sugar control. 2) Clear liquid diet. 3) Reprep with Miralax. 4) Retry for an EGD/Colonoscopy tomorrow.   LOS: 2 days   Theda Belfast 06/28/2013, 12:36 PM

## 2013-06-28 NOTE — Progress Notes (Signed)
Subjective:  Patient denies any chest pain or shortness of breath. Blood sugar is labile and had episode of hypoglycemia. The patient is being prepped for upper endoscopy and colonoscopy.  Objective:  Vital Signs in the last 24 hours: Temp:  [97.5 F (36.4 C)-98.2 F (36.8 C)] 98.1 F (36.7 C) (06/09 0800) Pulse Rate:  [56-88] 73 (06/09 0800) Resp:  [14-26] 15 (06/09 0800) BP: (153-178)/(52-76) 173/65 mmHg (06/09 0800) SpO2:  [99 %-100 %] 100 % (06/09 0800) Weight:  [54.9 kg (121 lb 0.5 oz)] 54.9 kg (121 lb 0.5 oz) (06/09 0400)  Intake/Output from previous day: 06/08 0701 - 06/09 0700 In: 2936.3 [P.O.:1320; I.V.:1616.3] Out: 1750 [Urine:1750] Intake/Output from this shift: Total I/O In: 75 [I.V.:75] Out: -   Physical Exam: Neck: no adenopathy, no carotid bruit, no JVD and supple, symmetrical, trachea midline Lungs: clear to auscultation bilaterally Heart: regular rate and rhythm, S1, S2 normal, no murmur, click, rub or gallop Abdomen: soft, non-tender; bowel sounds normal; no masses,  no organomegaly Extremities: extremities normal, atraumatic, no cyanosis or edema  Lab Results:  Recent Labs  06/27/13 0325 06/28/13 0247  WBC 5.3 4.7  HGB 9.5* 9.3*  PLT 211 227    Recent Labs  06/27/13 0325 06/28/13 0247  NA 135* 133*  K 4.6 5.0  CL 100 99  CO2 26 23  GLUCOSE 132* 174*  BUN 21 9  CREATININE 1.35* 0.98   No results found for this basename: TROPONINI, CK, MB,  in the last 72 hours Hepatic Function Panel  Recent Labs  06/26/13 1400  PROT 7.0  ALBUMIN 3.7  AST 24  ALT 21  ALKPHOS 116  BILITOT 0.6   No results found for this basename: CHOL,  in the last 72 hours No results found for this basename: PROTIME,  in the last 72 hours  Imaging: Imaging results have been reviewed and No results found.  Cardiac Studies:  Assessment/Plan:  status post uncontrolled diabetes mellitus  Hypertension  Hyperlipidemia  Status post hyponatremia  Chronic kidney  disease stage II improved  Status post dehydration  Hypochromic microcytic anemia rule out GI loss  Status post dehydration Plan Change insulin dose as per orders Increase Norvasc as per orders Check labs in a.m.   LOS: 2 days    Robynn Pane 06/28/2013, 11:06 AM

## 2013-06-28 NOTE — OR Nursing (Signed)
Pt came to endo for gi procedure, cbg 545. I stat lab obtained, glucose 532. Have paged dr Sharyn Lull. Dr hung is canceling egd/colon for now.

## 2013-06-29 ENCOUNTER — Encounter (HOSPITAL_COMMUNITY)
Admission: EM | Disposition: A | Discharge status: Home Health | Payer: Self-pay | Source: Home / Self Care | Attending: Cardiology

## 2013-06-29 ENCOUNTER — Encounter (HOSPITAL_COMMUNITY): Payer: Self-pay

## 2013-06-29 HISTORY — PX: ESOPHAGOGASTRODUODENOSCOPY: SHX5428

## 2013-06-29 HISTORY — PX: COLONOSCOPY: SHX5424

## 2013-06-29 LAB — CBC
HCT: 30.7 % — ABNORMAL LOW (ref 36.0–46.0)
Hemoglobin: 10.1 g/dL — ABNORMAL LOW (ref 12.0–15.0)
MCH: 25.3 pg — ABNORMAL LOW (ref 26.0–34.0)
MCHC: 32.9 g/dL (ref 30.0–36.0)
MCV: 76.8 fL — ABNORMAL LOW (ref 78.0–100.0)
PLATELETS: 232 10*3/uL (ref 150–400)
RBC: 4 MIL/uL (ref 3.87–5.11)
RDW: 13.7 % (ref 11.5–15.5)
WBC: 5.1 10*3/uL (ref 4.0–10.5)

## 2013-06-29 LAB — BASIC METABOLIC PANEL
BUN: 6 mg/dL (ref 6–23)
CO2: 21 mEq/L (ref 19–32)
CREATININE: 1.05 mg/dL (ref 0.50–1.10)
Calcium: 8.9 mg/dL (ref 8.4–10.5)
Chloride: 93 mEq/L — ABNORMAL LOW (ref 96–112)
GFR, EST AFRICAN AMERICAN: 60 mL/min — AB (ref >90)
GFR, EST NON AFRICAN AMERICAN: 51 mL/min — AB (ref >90)
Glucose, Bld: 196 mg/dL — ABNORMAL HIGH (ref 70–99)
Potassium: 4.4 mEq/L (ref 3.7–5.3)
Sodium: 131 mEq/L — ABNORMAL LOW (ref 137–147)

## 2013-06-29 LAB — GLUCOSE, CAPILLARY
GLUCOSE-CAPILLARY: 39 mg/dL — AB (ref 70–99)
GLUCOSE-CAPILLARY: 91 mg/dL (ref 70–99)
GLUCOSE-CAPILLARY: 174 mg/dL — AB (ref 70–99)
Glucose-Capillary: 133 mg/dL — ABNORMAL HIGH (ref 70–99)
Glucose-Capillary: 131 mg/dL — ABNORMAL HIGH (ref 70–99)
Glucose-Capillary: 35 mg/dL — CL (ref 70–99)
Glucose-Capillary: 154 mg/dL — ABNORMAL HIGH (ref 70–99)
Glucose-Capillary: 133 mg/dL — ABNORMAL HIGH (ref 70–99)
Glucose-Capillary: 86 mg/dL (ref 70–99)

## 2013-06-29 LAB — POCT I-STAT 4, (NA,K, GLUC, HGB,HCT)
Glucose, Bld: 532 mg/dL — ABNORMAL HIGH (ref 70–99)
HCT: 33 % — ABNORMAL LOW (ref 36.0–46.0)
Hemoglobin: 11.2 g/dL — ABNORMAL LOW (ref 12.0–15.0)
Potassium: 5.8 mEq/L — ABNORMAL HIGH (ref 3.7–5.3)
SODIUM: 126 meq/L — AB (ref 137–147)

## 2013-06-29 SURGERY — COLONOSCOPY
Anesthesia: Moderate Sedation

## 2013-06-29 MED ORDER — FENTANYL CITRATE 0.05 MG/ML IJ SOLN
INTRAMUSCULAR | Status: AC
  Filled 2013-06-29: qty 4

## 2013-06-29 MED ORDER — DIPHENHYDRAMINE HCL 50 MG/ML IJ SOLN
INTRAMUSCULAR | Status: AC
  Filled 2013-06-29: qty 1

## 2013-06-29 MED ORDER — MIDAZOLAM HCL 5 MG/ML IJ SOLN
INTRAMUSCULAR | Status: AC
  Filled 2013-06-29: qty 3

## 2013-06-29 MED ORDER — SODIUM CHLORIDE 0.9 % IV SOLN
INTRAVENOUS | Status: DC
  Administered 2013-06-29: 16:00:00 via INTRAVENOUS

## 2013-06-29 MED ORDER — GLUCOSE-VITAMIN C 4-6 GM-MG PO CHEW: 4.0000 | CHEWABLE_TABLET | ORAL | Status: DC | PRN

## 2013-06-29 MED ORDER — DEXTROSE 50 % IV SOLN
25.0000 mL | Freq: Once | INTRAVENOUS | Status: AC | PRN
  Administered 2013-06-29: 25 mL via INTRAVENOUS

## 2013-06-29 MED ORDER — FENTANYL CITRATE 0.05 MG/ML IJ SOLN
INTRAMUSCULAR | Status: DC | PRN
  Administered 2013-06-29 (×2): 25 ug via INTRAVENOUS

## 2013-06-29 MED ORDER — DEXTROSE 50 % IV SOLN
INTRAVENOUS | Status: AC
  Filled 2013-06-29: qty 50

## 2013-06-29 MED ORDER — LIDOCAINE VISCOUS 2 % MT SOLN
OROMUCOSAL | Status: DC | PRN
  Administered 2013-06-29: 10 mL via OROMUCOSAL

## 2013-06-29 MED ORDER — SPOT INK MARKER SYRINGE KIT
PACK | SUBMUCOSAL | Status: AC
  Filled 2013-06-29: qty 5

## 2013-06-29 MED ORDER — LIDOCAINE VISCOUS 2 % MT SOLN
OROMUCOSAL | Status: AC
  Filled 2013-06-29: qty 15

## 2013-06-29 MED ORDER — GLUCOSE 40 % PO GEL: 1.0000 | ORAL | Status: DC | PRN

## 2013-06-29 MED ORDER — MIDAZOLAM HCL 5 MG/5ML IJ SOLN
INTRAMUSCULAR | Status: DC | PRN
  Administered 2013-06-29 (×2): 2 mg via INTRAVENOUS
  Administered 2013-06-29 (×2): 1 mg via INTRAVENOUS

## 2013-06-29 NOTE — Op Note (Signed)
[name] [address] [place], [address]   OPERATIVE PROCEDURE REPORT  PATIENT :Lisa Carpenter, Lisa Carpenter  MR#: [id number] BIRTHDATE :1940/04/29 GENDER: Female ENDOSCOPIST: Lorenza Burton, MD ASSISTANT:   Kandice Robinsons, technician Harold Barban, RN PROCEDURE DATE: 2013-07-07 PRE-PROCEDURE PREPERATION: Patient fasted for 4 hours prior to procedure. PRE-PROCEDURE PHYSICAL: Patient has stable vital signs.  Neck is supple.  There is no JVD, thyromegaly or LAD.  Chest clear to auscultation.  S1 and S2 regular.  Abdomen soft, non-distended, non-tender with NABS. PROCEDURE:     EGD with small bowel biopsies. ASA CLASS:     Class II INDICATIONS:     Iron deficiency anemia. MEDICATIONS:     Fentanyl 50 mcg  & Versed 5 mg IV. TOPICAL ANESTHETIC:   Viscous Xylocaine-10 cc PO.  DESCRIPTION OF PROCEDURE: After the risks benefits and alternatives of the procedure were thoroughly explained, informed consent was obtained. The Pentax Gastroscope Peds [id number]  was introduced through the mouth and advanced to the second portion of the duodenum , without limitations. The instrument was slowly withdrawn as the mucosa was fully examined.   The esophagus and the GEJ bowel appeared normal. There were no ulcers, erosions, masses or polyps noted. Retroflexed views revealed a small hiatal hernia. Mild diffuse gastritis was noted in the antrum. Duodenitis was noted in the duodenal bulb with scalloping of the small bowel mucosa that was biopsied to rule out sprue. The scope was then withdrawn from the patient and the procedure terminated. The patient tolerated the procedure without immediate complications.  IMPRESSION:  1) Normal appearing esophagus. 2) Small hiatal hernia. 3). Mild antral gastritis. 4) Mild duodenitis in the duodenal bulb. 5) Scalloped small bowel folds-biopsied to rule out sprue.  RECOMMENDATIONS:     1.  Await pathology results. 2.  Anti-reflux  regimen to be followed. 3.  Proceed with a colonoscopy.   REPEAT EXAM:  None planned for now.  DISCHARGE INSTRUCTIONS: standard instructions given. _______________________________ eSigned:  Dr. Lorenza Burton, MD 07-07-13 6:01 PM   CPT CODES:     65993, EGD  DIAGNOSIS CODES:     280.9 Iron Deficiency Anemia 553.3  Hiatal hernia   CC: Rinaldo Cloud, MD  PATIENT NAME:  Lisa Carpenter, Lisa Carpenter MR#: [id number]

## 2013-06-29 NOTE — Progress Notes (Signed)
Hypoglycemic Event  CBG: 39  Treatment: D50 IV 25 mL  Symptoms: None  Follow-up CBG: Time:1635 CBG Result:133   Possible Reasons for Event: Inadequate meal intake  Comments/MD notified:Dr. Loreta Ave notified, hypoglycemic orders initiated.     Lisa Carpenter  Remember to initiate Hypoglycemia Order Set & complete

## 2013-06-29 NOTE — Progress Notes (Signed)
Subjective:  Patient denies any chest pain or shortness of breath. Had episode of hypoglycemia earlier today received D50 patient is n.p.o. scheduled for endoscopy today  Objective:  Vital Signs in the last 24 hours: Temp:  [97.7 F (36.5 C)-98.4 F (36.9 C)] 98.1 F (36.7 C) (06/10 1117) Pulse Rate:  [56-68] 68 (06/10 1117) Resp:  [14-22] 22 (06/10 1117) BP: (155-205)/(69-93) 172/69 mmHg (06/10 1117) SpO2:  [100 %] 100 % (06/10 1117) Weight:  [55.5 kg (122 lb 5.7 oz)] 55.5 kg (122 lb 5.7 oz) (06/10 0449)  Intake/Output from previous day: 06/09 0701 - 06/10 0700 In: 1695 [P.O.:1430; I.V.:265] Out: 600 [Urine:600] Intake/Output from this shift: Total I/O In: 100 [I.V.:100] Out: -   Physical Exam: Neck: no adenopathy, no carotid bruit, no JVD and supple, symmetrical, trachea midline Lungs: clear to auscultation bilaterally Heart: regular rate and rhythm, S1, S2 normal and Soft systolic murmur noted Abdomen: soft, non-tender; bowel sounds normal; no masses,  no organomegaly Extremities: extremities normal, atraumatic, no cyanosis or edema  Lab Results:  Recent Labs  06/28/13 0247 06/29/13 0407  WBC 4.7 5.1  HGB 9.3* 10.1*  PLT 227 232    Recent Labs  06/28/13 1500 06/29/13 0407  NA 131* 131*  K 4.5 4.4  CL 97 93*  CO2 23 21  GLUCOSE 374* 196*  BUN 8 6  CREATININE 1.03 1.05   No results found for this basename: TROPONINI, CK, MB,  in the last 72 hours Hepatic Function Panel  Recent Labs  06/26/13 1400  PROT 7.0  ALBUMIN 3.7  AST 24  ALT 21  ALKPHOS 116  BILITOT 0.6   No results found for this basename: CHOL,  in the last 72 hours No results found for this basename: PROTIME,  in the last 72 hours  Imaging: Imaging results have been reviewed and No results found.  Cardiac Studies:  Assessment/Plan:  status post uncontrolled diabetes mellitus  Hypertension  Hyperlipidemia  Status post hyponatremia  Chronic kidney disease stage II improved   Status post dehydration  Hypochromic microcytic anemia rule out GI loss  Status post dehydration  Plan Increase IV fluid D5W half normal saline to 75 cc per hour Monitor blood sugar closely Schedule for endoscopy today   LOS: 3 days    Kimble Hitchens N 06/29/2013, 11:56 AM

## 2013-06-29 NOTE — Op Note (Signed)
[name] [address] [place], [address]   OPERATIVE PROCEDURE REPORT  PATIENT: Lisa Carpenter, Lisa Carpenter  MR#: [id number] BIRTHDATE: 1940/09/19 GENDER: Female ENDOSCOPIST: Lorenza Burton, MD ASSISTANT:   Kandice Robinsons, technician Harold Barban, RN PROCEDURE DATE: 06/29/2013 PRE-PROCEDURE PREPARATION: The patient was prepped with a gallon of Golytely the night prior to the procedure.  The patient was fasted for 8 hours prior to the procedure.  PRE-PROCEDURE PHYSICAL: Patient has stable vital signs.  Neck is supple.  There is no JVD, thyromegaly or LAD.  Chest clear to auscultation.  S1 and S2 regular.  Abdomen soft, non-distended, non-tender with NABS. PROCEDURE:     Colonoscopy, diagnostic ASA CLASS:     Class III INDICATIONS:     1.  Iron deficiency anemia.   2.  Colorectal cancer screening. MEDICATIONS:     Versed 1mg  IV  DESCRIPTION OF PROCEDURE: After the risks, benefits, and alternatives of the procedure were thoroughly explained [including a 10% missed rate of cancer and polyps], informed consent was obtained.  Digital rectal exam was performed.  The Pentax Adult video colonoscope [id number]  was introduced through the anus  and advanced to the cecum, which was identified by both the appendix and ileocecal valve. No adverse events experienced.  The quality of the prep was fair, at best as there was a large amount of residual stool in the colon. Multiple washes were done. Small lesions could be missed. The instrument was then slowly withdrawn as the colon was fully examined.     COLON FINDINGS: Patient has decreased sphincter tone. NormaThe entire colonic mucosa appeared healthy with a normal vascular pattern.  No masses, polyps, diverticula or AVMs were noted. The appendiceal orifice and the ICV were identified and photographed. The terminal ileum appeared normal. Retroflexion was attempted but was not possible due to decreased sphincter  tone.d views revealed no abnormalities.  The patient tolerated the procedure without immediate complications.  The scope was then withdrawn from the patient and the procedure terminated.  TIME TO CECUM:  4 minutes 00 seconds WITHDRAW TIME: 6 minutes 00 seconds  IMPRESSION:     Decreased sphincter tone but otherwise normal appearing colon upto the cecum.  RECOMMENDATIONS:     1.  High fiber diet with liberal fluid intake. 2.  Out patient follow-up in 2 weeks.  REPEAT EXAM:      In 10 years  for a repeat colonoscopy.  If the patient has any abnormal GI symptoms in the interim, she has been advised to contact the office as soon as possible for further recommendations.   CPT CODES:     [id number], Colorectal Screening  DIAGNOSIS CODES:     280.9 Iron Deficiency Anemia V76.51 Colorectal cancer screening   REFERRED EB:RAXEN Sharyn Lull, M.D.  eSigned:  Dr. Lorenza Burton, MD 06/29/2013 6:12 PM   PATIENT NAME:  Lisa Carpenter, Lisa Carpenter MR#: [id number]

## 2013-06-30 ENCOUNTER — Encounter (HOSPITAL_COMMUNITY): Payer: Self-pay | Admitting: Gastroenterology

## 2013-06-30 LAB — GLUCOSE, CAPILLARY
GLUCOSE-CAPILLARY: 113 mg/dL — AB (ref 70–99)
GLUCOSE-CAPILLARY: 33 mg/dL — AB (ref 70–99)
GLUCOSE-CAPILLARY: 58 mg/dL — AB (ref 70–99)
GLUCOSE-CAPILLARY: 23 mg/dL — AB (ref 70–99)
GLUCOSE-CAPILLARY: 121 mg/dL — AB (ref 70–99)
GLUCOSE-CAPILLARY: 46 mg/dL — AB (ref 70–99)
Glucose-Capillary: 579 mg/dL (ref 70–99)
Glucose-Capillary: >600 mg/dL (ref 70–99)
Glucose-Capillary: 574 mg/dL (ref 70–99)
Glucose-Capillary: 122 mg/dL — ABNORMAL HIGH (ref 70–99)
Glucose-Capillary: 41 mg/dL — CL (ref 70–99)
Glucose-Capillary: 117 mg/dL — ABNORMAL HIGH (ref 70–99)
Glucose-Capillary: 63 mg/dL — ABNORMAL LOW (ref 70–99)
Glucose-Capillary: 72 mg/dL (ref 70–99)
Glucose-Capillary: 90 mg/dL (ref 70–99)

## 2013-06-30 LAB — BASIC METABOLIC PANEL
BUN: 9 mg/dL (ref 6–23)
CHLORIDE: 94 meq/L — AB (ref 96–112)
CO2: 25 mEq/L (ref 19–32)
Calcium: 9 mg/dL (ref 8.4–10.5)
Creatinine, Ser: 1.3 mg/dL — ABNORMAL HIGH (ref 0.50–1.10)
GFR calc Af Amer: 46 mL/min — ABNORMAL LOW (ref >90)
GFR calc non Af Amer: 40 mL/min — ABNORMAL LOW (ref >90)
GLUCOSE: 201 mg/dL — AB (ref 70–99)
POTASSIUM: 5.6 meq/L — AB (ref 3.7–5.3)
SODIUM: 130 meq/L — AB (ref 137–147)

## 2013-06-30 MED ORDER — DEXTROSE 50 % IV SOLN: 50.0000 mL | Freq: Once | INTRAVENOUS | Status: AC | PRN

## 2013-06-30 MED ORDER — FERROUS GLUCONATE 324 (38 FE) MG PO TABS: 324.0000 mg | ORAL_TABLET | Freq: Every day | ORAL | Status: AC

## 2013-06-30 MED ORDER — INSULIN ASPART 100 UNIT/ML ~~LOC~~ SOLN
15.0000 [IU] | Freq: Once | SUBCUTANEOUS | Status: AC
  Administered 2013-06-30: 15 [IU] via SUBCUTANEOUS

## 2013-06-30 MED ORDER — INSULIN GLARGINE 100 UNIT/ML ~~LOC~~ SOLN: 18.0000 [IU] | Freq: Every day | SUBCUTANEOUS | Status: AC

## 2013-06-30 MED ORDER — GLUCOSE 40 % PO GEL: 1.0000 | ORAL | Status: DC | PRN

## 2013-06-30 MED ORDER — DEXTROSE 50 % IV SOLN
25.0000 mL | Freq: Once | INTRAVENOUS | Status: AC | PRN
  Administered 2013-06-30: 25 mL via INTRAVENOUS

## 2013-06-30 MED ORDER — PANTOPRAZOLE SODIUM 40 MG PO TBEC
40.0000 mg | DELAYED_RELEASE_TABLET | Freq: Every day | ORAL | Status: AC
Start: 2013-06-30 — End: ?

## 2013-06-30 MED ORDER — DEXTROSE 50 % IV SOLN
INTRAVENOUS | Status: AC
  Filled 2013-06-30: qty 50

## 2013-06-30 MED ORDER — GLUCOSE-VITAMIN C 4-6 GM-MG PO CHEW: 4.0000 | CHEWABLE_TABLET | ORAL | Status: DC | PRN

## 2013-06-30 NOTE — Discharge Instructions (Signed)
Glucose, Blood Sugar, Fasting Blood Sugar This is a test to measure your blood sugar. Glucose is a simple sugar that serves as the main source of energy for the body. The carbohydrates we eat are broken down into glucose (and a few other simple sugars), absorbed by the small intestine, and circulated throughout the body. Most of the body's cells require glucose for energy production; brain and nervous system cells not only rely on glucose for energy, they can only function when glucose levels in the blood remain above a certain level.  The body's use of glucose hinges on the availability of insulin, a hormone produced by the pancreas. Insulin acts as a Control and instrumentation engineer, transporting glucose into the body's cells, directing the body to store excess glucose as glycogen (for short-term storage) and/or as triglycerides in fat cells. We can not live without glucose or insulin, and they must be in balance.  Normally, blood glucose levels rise slightly after a meal, and insulin is secreted to lower them, with the amount of insulin released matched up with the size and content of the meal. If blood glucose levels drop too low, such as might occur in between meals or after a strenuous workout, glucagon (another pancreatic hormone) is secreted to tell the liver to turn some glycogen back into glucose, raising the blood glucose levels. If the glucose/insulin feedback mechanism is working properly, the amount of glucose in the blood remains fairly stable. If the balance is disrupted and glucose levels in the blood rise, then the body tries to restore the balance, both by increasing insulin production and by excreting glucose in the urine.  PREPARATION FOR TEST A blood sample drawn from a vein in your arm or, for a self check, a drop of blood from a skin prick; in general, it may be recommended that you fast before having a blood glucose test; sometimes a random (no preparation) urine sample is used. Your caregiver will  instruct you as to what they want prior to your testing. NORMAL FINDINGS Normal values depend on many factors. Your lab will provide a range of normal values with your test results. The following information summarizes the meaning of the test results. These are based on the clinical practice recommendations of the American Diabetes Association.  FASTING BLOOD GLUCOSE  From 70 to 99 mg/dL (3.9 to 5.5 mmol/L): Normal glucose tolerance  From 100 to 125 mg/dL (5.6 to 6.9 mmol/L):Impaired fasting glucose (pre-diabetes)  126 mg/dL (7.0 mmol/L) and above on more than one testing occasion: Diabetes ORAL GLUCOSE TOLERANCE TEST (OGTT) [EXCEPT PREGNANCY] (2 HOURS AFTER A 75-GRAM GLUCOSE DRINK)  Less than 140 mg/dL (7.8 mmol/L): Normal glucose tolerance  From 140 to 200 mg/dL (7.8 to 11.1 mmol/L): Impaired glucose tolerance (pre-diabetes)  Over 200 mg/dL (11.1 mmol/L) on more than one testing occasion: Diabetes GESTATIONAL DIABETES SCREENING: GLUCOSE CHALLENGE TEST (1 HOUR AFTER A 50-GRAM GLUCOSE DRINK)  Less than 140* mg/dL (7.8 mmol/L): Normal glucose tolerance  140* mg/dL (7.8 mmol/L) and over: Abnormal, needs OGTT (see below) * Some use a cutoff of More Than 130 mg/dL (7.2 mmol/L) because that identifies 90% of women with gestational diabetes, compared to 80% identified using the threshold of More Than 140 mg/dL (7.8 mmol/L). GESTATIONAL DIABETES DIAGNOSTIC: OGTT (100-GRAM GLUCOSE DRINK)  Fasting*..........................................95 mg/dL (5.3 mmol/L)  1 hour after glucose load*..............180 mg/dL (10.0 mmol/L)  2 hours after glucose load*.............155 mg/dL (8.6 mmol/L)  3 hours after glucose load* **.........140 mg/dL (7.8 mmol/L) * If two or more values  are above the criteria, gestational diabetes is diagnosed. ** A 75-gram glucose load may be used, although this method is not as well validated as the 100-gram OGTT; the 3-hour sample is not drawn if 75 grams is used.    Ranges for normal findings may vary among different laboratories and hospitals. You should always check with your doctor after having lab work or other tests done to discuss the meaning of your test results and whether your values are considered within normal limits. MEANING OF TEST  Your caregiver will go over the test results with you and discuss the importance and meaning of your results, as well as treatment options and the need for additional tests if necessary. OBTAINING THE TEST RESULTS It is your responsibility to obtain your test results. Ask the lab or department performing the test when and how you will get your results. Document Released: 02/08/2004 Document Revised: 03/31/2011 Document Reviewed: 12/18/2007 [facility] Patient Information 2014 [employer], [place].  Diabetes, Type 2, Am I At Risk? Diabetes is a lasting (chronic) disease. In type 2 diabetes, the pancreas does not make enough insulin, and the body does not respond normally to the insulin that is made. This type of diabetes was also previously called adult onset diabetes. About 90% of all those who have diabetes have type 2. It usually occurs after the age of 73, but can occur at any age.  People develop type 2 diabetes because they do not use insulin properly. Eventually, the pancreas cannot make enough insulin for the body's needs. Over time, the amount of glucose (sugar) in the blood increases. RISK FACTORS  Overweight  the more weight you have, the more resistant your cells become to insulin.  Family history  you are more likely to get diabetes if a parent or sibling has diabetes.  Race certain races get diabetes more.  African Americans.  American Indians.  Asian Americans.  Hispanics.  Pacific Islander.  Inactive exercise helps control weight and helps your cells be more sensitive to insulin.  Gestational diabetes  some women develop diabetes while they are pregnant. This goes away when they deliver. However,  they are 50-60% more likely to develop type 2 diabetes at a later time.  Having a baby over 9 pounds  a sign that you may have had gestational diabetes.  Age the risk of diabetes goes up as you get older, especially after age 73.  High blood pressure (hypertension). SYMPTOMS Many people have no signs or symptoms. Symptoms can be so mild that you might not even notice them. Some of these signs are:  Increased thirst.  Increased hunger.  Tiredness (fatigue).  Increased urination, especially at night.  Weight loss.  Blurred vision.  Sores that do not heal. WHO SHOULD BE TESTED?  Anyone 45 years or older, especially if overweight, should consider getting tested.  If you are younger than 45, overweight, and have one or more of the risk factors, you should consider getting tested. DIAGNOSIS  Fasting blood glucose (FBS). Usually, 2 are done.  FBS 101-125 mg/dl is considered pre-diabetes.  FBS 126 mg/dl or greater is considered diabetes.  2 hour Oral Glucose Tolerance Test (OGTT). This test is preformed by first having you not eat or drink for several hours. You are then given something sweet to drink and your blood glucose is measured fasting, at one hour and 2 hours. This test tells how well you are able to handle sugars or carbohydrates.  Fasting: 60-100 mg/dl.  1 hour: less  than 200 mg/dl.  2 hours: less than 140 mg/dl.  A1c A1c is a blood glucose test that gives and average of your blood glucose over 3 months. It is the accepted method to use to diagnose diabetes.  A1c 5.7-6.4% is considered pre-diabetes.  A1c 6.5% or greater is considered diabetes. WHAT DOES IT MEAN TO HAVE PRE-DIABETES? Pre-diabetes means you are at risk for getting type 2 diabetes. Your blood glucose is higher than normal, but not yet high enough to diagnose diabetes. The good news is, if you have pre-diabetes you can reduce the risk of getting diabetes and even return to normal blood glucose levels.  With modest weight loss and moderate physical activity, you can delay or prevent type 2 diabetes.  PREVENTION You cannot do anything about race, age or family history, but you can lower your chances of getting diabetes. You can:   Exercise regularly and be active.  Reduce fat and calorie intake.  Make wise food choices as much as you can.  Reduce your intake of salt and alcohol.  Maintain a reasonable weight.  Keep blood pressure in an acceptable range. Take medication if needed.  Not smoke.  Maintain an acceptable cholesterol level (HDL, LDL, Triglycerides). Take medication if needed. DOING MY PART: GETTING STARTED Making big changes in your life is hard, especially if you are faced with more than one change. You can make it easier by taking these steps:  Make a plan to change behavior.  Decide exactly what you will do and when you will do it.  Plan what you need to get ready.  Think about what might prevent you from reaching your goals.  Find family and friends who will support and encourage you.  Decide how you will reward yourself when you do what you have planned.  Your doctor, dietitian, or counselor can help you make a plan. HERE ARE SOME OF THE AREAS YOU MAY WISH TO CHANGE TO REDUCE YOUR RISK OF DIABETES. If you are overweight or obese, choose sensible ways to get in shape. Even small amounts of weight loss, like 5-10 pounds, can help reduce the effects of insulin resistance and help blood glucose control. Diet  Avoid crash diets. Instead, eat less of the foods you usually have. Limit the amount of fat you eat.  Increase your physical activity. Aim for at least 30 minutes of exercise most days of the week.  Set a reasonable weight-loss goal, such as losing 1 pound a week. Aim for a long-term goal of losing 5-7% of your total body weight.  Make wise food choices most of the time.  What you eat has a big impact on your health. By making wise food choices, you can  help control your body weight, blood pressure, and cholesterol.  Take a hard look at the serving sizes of the foods you eat. Reduce serving sizes of meat, desserts, and foods high in fat. Increase your intake of fruits and vegetables.  Limit your fat intake to about 25% of your total calories. For example, if your food choices add up to about 2,000 calories a day, try to eat no more than 56 grams of fat. Your caregiver or a dietitian can help you figure out how much fat to have. You can check food labels for fat content too.  You may also want to reduce the number of calories you have each day.  Keep a food log. Write down what you eat, how much you eat, and anything else  that helps keep you on track.  When you meet your goal, reward yourself with a nonfood item or activity. Exercise  Be physically active every day.  Keep and exercise log. Write down what exercise you did, for how long, and anything else that keeps you on track.  Regular exercise (like brisk walking) tackles several risk factors at once. It helps you lose weight, it keeps your cholesterol and blood pressure under control, and it helps your body use insulin. People who are physically active for 30 minutes a day, 5 days a week, reduced their risk of type 2 diabetes. If you are not very active, you should start slowly at first. Talk with your caregiver first about what kinds of exercise would be safe for you. Make a plan to increase your activity level with the goal of being active for at least 30 minutes a day, most days of the week.  Choose activities you enjoy. Here are some ways to work extra activity into your daily routine:  Take the stairs rather than an elevator or escalator.  Park at the far end of the lot and walk.  Get off the bus a few stops early and walk the rest of the way.  Walk or bicycle instead of drive whenever you can. Medications Some people need medication to help control their blood pressure or  cholesterol levels. If you do, take your medicines as directed. Ask your caregiver whether there are any medicines you can take to prevent type 2 diabetes. Document Released: 01/09/2003 Document Revised: 03/31/2011 Document Reviewed: 10/04/2008 [facility] Patient Information 2014 [place], [place].

## 2013-06-30 NOTE — Progress Notes (Signed)
Patient consumed about 90% of her dinner.  CBG rechecked and was 46.  MD paged and notified.  Verbal order given for 25 mL of 50% Dextrose IV to be administered now and CBG rechecked in 15 minutes.

## 2013-06-30 NOTE — Progress Notes (Signed)
Called and spoke to RN regarding patient's low CBG this morning.  She states that patient was asymptomatic with low CBG this morning.  Called Dr. Sharyn Lull to discuss potential for home health to assist patient with diabetes management.  Order received and consult placed for Care management.  Will also need to check lunch CBG to make sure that glucose is stable prior to discharge.  Discussed with RN.  Thanks,  Beryl Meager, RN, BC-ADM Inpatient Diabetes Coordinator Pager [phone]

## 2013-06-30 NOTE — Progress Notes (Signed)
Patient consumed 2 packs of graham crackers, 2 packs of peanut butter, and 8 ounces of apple juice.  CBG recheck was 45. Dinner was delivered and patient has begun eating.  Patient remains stable and asymptomatic at this time. MD made aware.  Will check pt CBG again in 15 minutes.

## 2013-06-30 NOTE — Progress Notes (Signed)
CBG recheck was 122.  BMET results also posted.  MD paged and notified of these results.  RN expressed concern for having no CBG checks overnight.  MD gave verbal order to change frequency of CBGs to q 4 hrs.

## 2013-06-30 NOTE — Progress Notes (Signed)
Inpatient Diabetes Program Recommendations  AACE/ADA: New Consensus Statement on Inpatient Glycemic Control (2013)  Target Ranges:  Prepandial:   less than 140 mg/dL      Peak postprandial:   less than 180 mg/dL (1-2 hours)      Critically ill patients:  140 - 180 mg/dL   Reason for Assessment:Results for ZAMARIYA, BEEGHLY (MRN [id number]) as of 06/30/2013 17:17  Ref. Range 06/30/2013 07:42 06/30/2013 08:02 06/30/2013 08:28 06/30/2013 12:30 06/30/2013 12:41 06/30/2013 13:32 06/30/2013 15:10 06/30/2013 15:45  Glucose-Capillary Latest Range: 70-99 mg/dL 33 (LL) 58 (L) 924 (H) 579 (HH) >600 (HH) 574 (HH)  122 (H)   CBG's continue to be labile.  Note orders for patient's CBG's to be checked q 4 hours throughout the night.  May consider endocrinology consult for patient? Will follow. Beryl Meager, RN, BC-ADM Inpatient Diabetes Coordinator Pager [phone]

## 2013-06-30 NOTE — Progress Notes (Signed)
Patient's CBG recheck was 121. MD paged and made aware.  Verbal order given for CBGs to be performed q 1 hr for the next 6 hours.  Patient is stable at this time.  Will continue to monitor.

## 2013-06-30 NOTE — Progress Notes (Signed)
Clinical Social Work Department BRIEF PSYCHOSOCIAL ASSESSMENT 06/30/2013  Patient:  Lisa Carpenter, Lisa Carpenter     Account Number:  [id number]     Admit date:  06/26/2013  Clinical Social Worker:  Varney Biles  Date/Time:  06/30/2013 01:23 PM  Referred by:  Physician  Date Referred:  06/30/2013 Referred for  Other - See comment   Other Referral:   ALF resource list   Interview type:  Patient Other interview type:    PSYCHOSOCIAL DATA Living Status:  ALONE Admitted from facility:   Level of care:   Primary support name:  [place] [phone]) Primary support relationship to patient:  [place] Degree of support available:   Good--pt has support from neighbor, sister, and niece.    CURRENT CONCERNS Current Concerns  Other - See comment   Other Concerns:   Community support/ALF information    SOCIAL WORK ASSESSMENT / PLAN CSW and RNCM spoke with pt about current supports, and RNCM asked if pt would be interested in ALF. Pt states she is not interested at this time, but would consider it in the future. CSW provided ALF list; RNCM arranging home health RN, and pt states her niece can possibly stay with her at night when she discharges.   Assessment/plan status:  No Further Intervention Required Other assessment/ plan:   Information/referral to community resources:   ALF list provided    PATIENT'S/FAMILY'S RESPONSE TO PLAN OF CARE: Good--pt participated in conversation with CSW and thanked CSW and RNCM for information/assistance. CSW signing off.       Maryclare Labrador, MSW, [facility] Clinical Social Worker [phone]

## 2013-06-30 NOTE — Progress Notes (Signed)
Patient given graham crackers, peanut butter,and apple juice to prevent blood sugar from dropping again.  Patient stated that she "feels like her blood sugar is low again".  Another CBG obtained, result was 23.  Patient appears asymptomatic and stable at this time.  Will continue to monitor and recheck CBG in about 20 minutes.

## 2013-06-30 NOTE — Discharge Summary (Signed)
  Discharge summary dictated on 06/30/2013

## 2013-06-30 NOTE — Progress Notes (Signed)
Patient's CBG 579.  Dr Sharyn Lull notified.  He requested that it be repeated to check for accuracy of the reading.  When CBG was repeated, it was >600.  The exact number could not be resulted by the glucometer.  Dr Sharyn Lull paged and notified of this result.  He gave verbal order for 15 units of Novolog, BMET to be drawn, and repeat CBG in one hour.  Patient appears to be asymptomatic and stable at this time.  Will continue to monitor.

## 2013-06-30 NOTE — Discharge Summary (Signed)
Lisa Carpenter:  Lisa Carpenter, Lisa Carpenter             ACCOUNT NO.:  [id number]  MEDICAL RECORD NO.:  [id number]  LOCATION:  2C03C                        FACILITY:  [facility]  PHYSICIAN:  Tylasia Fletchall N. Sharyn LullHarwani, M.D. DATE OF BIRTH:  [birth year]  DATE OF ADMISSION:  06/26/2013 DATE OF DISCHARGE:  06/30/2013                              DISCHARGE SUMMARY   ADMITTING DIAGNOSES: 1. Uncontrolled diabetes mellitus. 2. Dehydration. 3. Renal dysfunction secondary to dehydration. 4. Hyponatremia. 5. Hyperkalemia. 6. Anemia. 7. Hypertension. 8. Hypercholesteremia.  FINAL DIAGNOSES: 1. Status post uncontrolled diabetes mellitus.  Status post acute     renal insufficiency secondary to dehydration. 2. Status post dehydration. 3. Status post hyponatremia. 4. Microcytic, hypochromic anemia status post EGD and colonoscopy not     revealing any evidence of acute or chronic bleed, mild antral and     duodenal gastritis. 5. Hypertension. 6. Hypercholesteremia.  DISCHARGE HOME MEDICATIONS: 1. Lantus insulin 18 units subcu daily with breakfast. 2. Amlodipine 5 mg daily. 3. Aspirin 81 mg daily. 4. Atorvastatin 40 mg daily. 5. Cyclosporine ophthalmic emulsion as before. 6. Losartan/hydrochlorothiazide 100/12.5 mg 1 tablet daily. 7. Magnesium oxide 500 mg daily. 8. Multivitamin with minerals 1 daily. 9. Bystolic 10 mg 1 tablet daily. 10.The patient has been advised to stop spironolactone. 11.Protonix 40 mg 1 tablet daily. 12.Feosol 325 mg 1 tablet daily.  DIET:  Low salt, low cholesterol, 1800 calories ADA diet.  The patient has been advised to monitor blood sugar closely in chart.  If blood sugar is running above 200, please call my office immediately.  FOLLOWUP:  Follow up with me in 1 week.  CONDITION AT DISCHARGE:  Stable.  BRIEF HISTORY AND HOSPITAL COURSE:  [name] is a 73 year old female with past medical history significant for hypertension, diabetes mellitus, hypercholesteremia, came to the ER  complaining of feeling dizzy, and her blood sugar running above 500 since yesterday.  She generally measures her sugar daily and it has never been this high.  She has been taking her insulin and eating normally.  She denies any recent illness, fever, chills, nausea, vomiting, abdominal pain, diarrhea, shortness of breath, chest pain, cough.  She was diagnosed to have diabetes 15 years ago.  She takes 15 units of Lantus in the morning before breakfast.  This morning, she ate sausage, crackers, eggs which is normal for her.  She also has polyuria, states this is due to her __________.  Checked her blood sugar which was above 500.  The patient denies any abdominal pain.  No chest pain.  No fever.  No nausea, no shortness of breath or vomiting.  PAST MEDICAL HISTORY:  As above.  PHYSICAL EXAMINATION:  VITAL SIGNS:  Her blood pressure was 197/69, pulse was 58.  She was afebrile. HEENT:  Conjunctivae pink. NECK:  Supple.  No JVD.  No bruit. LUNGS:  Clear to auscultation without rhonchi or rales. CARDIOVASCULAR:  S1, S2 was normal.  There was no S3 gallop. ABDOMEN:  Soft.  Bowel sounds were present.  Nontender. EXTREMITIES:  There was no clubbing, cyanosis, or edema.  LABORATORY DATA:  Sodium was 120, potassium 6.0, BUN 30, creatinine 1.79, blood sugar was 565.  Hemoglobin was 9.9, hematocrit 30,  white count of 5.1.  Repeat electrolytes on 06/07: Sodium 135, potassium 4.6, BUN 21, creatinine 1.35.  Hemoglobin 9.5, hematocrit 29.2, white count of 5.3.  Her last sodium was 131, potassium 4.4, BUN 6, creatinine 1.05. Hemoglobin 10.1, hematocrit 30.7, white count of 5.1, which has been stable.  The patient had upper endoscopy done yesterday which showed small hiatus hernia, normal appearing esophagus, mild antral gastritis, mild duodenitis in the duodenal bulb.  Colonoscopy showed decreased sphincter tone but otherwise normal appearing colon into the cecum.  BRIEF HOSPITAL COURSE:  The  patient was admitted to step-down unit, was started on IV insulin which was switched to subcu Lantus insulin.  The patient had 2 episodes of hypoglycemia as the patient was being prepped for colonoscopy and upper endoscopy and then subsequently had marked hyperglycemia requiring cancellation of the endoscopy and colonoscopy. The patient subsequently underwent upper endoscopy and colonoscopy as per procedure report.  The patient's hemoglobin remained stable during the hospital stay.  The patient has resumed her diet and is tolerating it well.  We will monitor blood sugar one more time later today.  If it remains stable, she will be discharged home.  The patient has been advised to monitor blood sugar at least twice daily in chart, and she will be followed up as an outpatient.     Eduardo Osier. Sharyn Lull, M.D.     [facility]  D:  06/30/2013  T:  06/30/2013  Job:  [id number]

## 2013-06-30 NOTE — Progress Notes (Addendum)
CBG result 1 hour after insulin administration is 574. Dr Sharyn Lull paged and notified. Verbal orders given to administer another 15 units of Novolog insulin and recheck CBG in 2 hours.

## 2013-07-01 LAB — GLUCOSE, CAPILLARY
GLUCOSE-CAPILLARY: 103 mg/dL — AB (ref 70–99)
Glucose-Capillary: 137 mg/dL — ABNORMAL HIGH (ref 70–99)
Glucose-Capillary: 154 mg/dL — ABNORMAL HIGH (ref 70–99)
Glucose-Capillary: 285 mg/dL — ABNORMAL HIGH (ref 70–99)

## 2013-07-01 NOTE — Progress Notes (Signed)
Pt is alert and oriented x 4. Pt has complained of no pain. Dr. Lorenza EvangelistHarvani has examined pt and pt has been in no distress. Pt's vitals within normal limits. Pt's blood sugar has been above 100 and below 300. Pt is being discharged to home per md orders. Had originally held lantus this am but Dr. Lorenza EvangelistHarvani seen pt and stated to give at lunch time. Nurse administered. Pt ate lunch. Pt is seeing dm educator before leaving. Pt has a ride coming to get her.

## 2013-07-01 NOTE — Progress Notes (Signed)
Iv removed, tip intact and pt tolerated well. CNA assisted pt with belongings and getting dressed and taking pt to d/c doors. Nurse reviewed prescriptions and d/c instructions with pt and pt had no questions.

## 2013-07-01 NOTE — Progress Notes (Addendum)
Inpatient Diabetes Program Recommendations  AACE/ADA: New Consensus Statement on Inpatient Glycemic Control (2013)  Target Ranges:  Prepandial:   less than 140 mg/dL      Peak postprandial:   less than 180 mg/dL (1-2 hours)      Critically ill patients:  140 - 180 mg/dL   Reason for Assessment:  Results for Lisa Carpenter, Lisa Carpenter (MRN [id number]) as of 07/01/2013 08:35  Ref. Range 06/30/2013 13:32 06/30/2013 15:10 06/30/2013 15:45 06/30/2013 17:21 06/30/2013 18:09 06/30/2013 18:41 06/30/2013 19:16 06/30/2013 20:17 06/30/2013 21:31 06/30/2013 22:22  Glucose-Capillary Latest Range: 70-99 mg/dL 409574 (HH)  811122 (H) 23 (LL) 41 (LL) 46 (L) 121 (H) 117 (H) 72 63 (L)    Note wide fluctuations in CBG's on 06/30/13.  Consider reducing Lantus to 18 units daily. Patient appears to be very sensitive to insulin and 1 unit of insulin likely drops CBG more than 50 mg/dL. Consider reducing Novolog correction to custom scale starting at 150 mg/dL ([phone] mg/dL-1 unit, [phone] mg/dL-2 units, 086-578251-300 mg/dL-3 units, [phone] mg/dL- 4 units, 528-413351-400 mg/dL-5 units, [phone] mg/dL- 6 units).  May consider endocrinology consult regarding erratic CBG's.    Thanks, Beryl MeagerJenny Jajuan Skoog, RN, BC-ADM Inpatient Diabetes Coordinator Pager [phone]

## 2013-07-01 NOTE — Progress Notes (Signed)
Subjective:  Patient denies any chest pain or shortness of breath. Doing well. No further episodes of hypoglycemia. Last few blood sugars are as follows last 8 blood sugar readings are between 63-154. This a.m. blood sugar was 103 now it is to 85 did not receive her Lantus earlier this morning. Patient very sensitive to insulin will stop sliding scale. insulin and Lantus insulin has been increased to 18 units per day. Patient has been advised to increase one to 2 units of Lantus insulin if the blood sugar stays above 200.  Objective:  Vital Signs in the last 24 hours: Temp:  [98 F (36.7 C)-98.8 F (37.1 C)] 98 F (36.7 C) (06/12 0807) Pulse Rate:  [62-72] 62 (06/12 0400) Resp:  [12-22] 16 (06/12 0400) BP: (110-126)/(52-91) 126/56 mmHg (06/12 0959) SpO2:  [98 %-100 %] 100 % (06/12 0400) Weight:  [54.6 kg (120 lb 5.9 oz)] 54.6 kg (120 lb 5.9 oz) (06/12 0400)  Intake/Output from previous day: 06/11 0701 - 06/12 0700 In: 114.2 [I.V.:114.2] Out: 1050 [Urine:1050] Intake/Output from this shift: Total I/O In: 250 [P.O.:250] Out: 800 [Urine:800]  Physical Exam: Exam unchanged  Lab Results:  Recent Labs  06/28/13 1258 06/29/13 0407  WBC  --  5.1  HGB 11.2* 10.1*  PLT  --  232    Recent Labs  06/29/13 0407 06/30/13 1510  NA 131* 130*  K 4.4 5.6*  CL 93* 94*  CO2 21 25  GLUCOSE 196* 201*  BUN 6 9  CREATININE 1.05 1.30*   No results found for this basename: TROPONINI, CK, MB,  in the last 72 hours Hepatic Function Panel No results found for this basename: PROT, ALBUMIN, AST, ALT, ALKPHOS, BILITOT, BILIDIR, IBILI,  in the last 72 hours No results found for this basename: CHOL,  in the last 72 hours No results found for this basename: PROTIME,  in the last 72 hours  Imaging: Imaging results have been reviewed and No results found.  Cardiac Studies:  Assessment/Plan:  1. Status post uncontrolled diabetes mellitus. Status post acute  renal insufficiency secondary to  dehydration.  2. Status post dehydration.  3. Status post hyponatremia.  4. Microcytic, hypochromic anemia status post EGD and colonoscopy not  revealing any evidence of acute or chronic bleed, mild antral and  duodenal gastritis.  5. Hypertension.  6. Hypercholesteremia. Plan DC home No changes in the discharge summary.  LOS: 5 days    Lisa Carpenter N 07/01/2013, 11:30 AM

## 2013-07-01 NOTE — Progress Notes (Signed)
Inpatient Diabetes Program Recommendations  AACE/ADA: New Consensus Statement on Inpatient Glycemic Control (2013)  Target Ranges:  Prepandial:   less than 140 mg/dL      Peak postprandial:   less than 180 mg/dL (1-2 hours)      Critically ill patients:  140 - 180 mg/dL   Reason for Visit: Patient being discharged home today.  She states that she does see endocrinologist, Dr. Chestine Sporelark.  She has an appointment in July with him.  Briefly discussed hypoglycemia treatment and prevention.  She states that she keeps orange juice by her bed at night in case her CBG's drop.  She states that she does not get sweaty but knows she is having low blood sugar based on her mouth feeling swollen.  She plans to have family stay with her this weekend.  She also will have home health to assist her with diabetes management.  Encouraged her to check and record blood glucose at least 4 times a day.  MD has given her instructions to increase Lantus by 2 units if she is consistently greater than 200 mg/dL.  Explained that Lantus affects mostly her fasting CBG's.  Patient verbalized understanding.  Will benefit from home health nurse as ordered.  Beryl MeagerJenny Skylynne Schlechter, RN, BC-ADM Inpatient Diabetes Coordinator Pager [phone]

## 2013-08-20 DEATH — deceased

## 2014-02-28 IMAGING — CR DG ELBOW COMPLETE 3+V*R*
4 series · 4 of 4 positions shown · non-contrast
Comparison: None

CLINICAL DATA: Elbow pain.

RIGHT ELBOW - COMPLETE 3+ VIEW

[x elbow joint ap right]
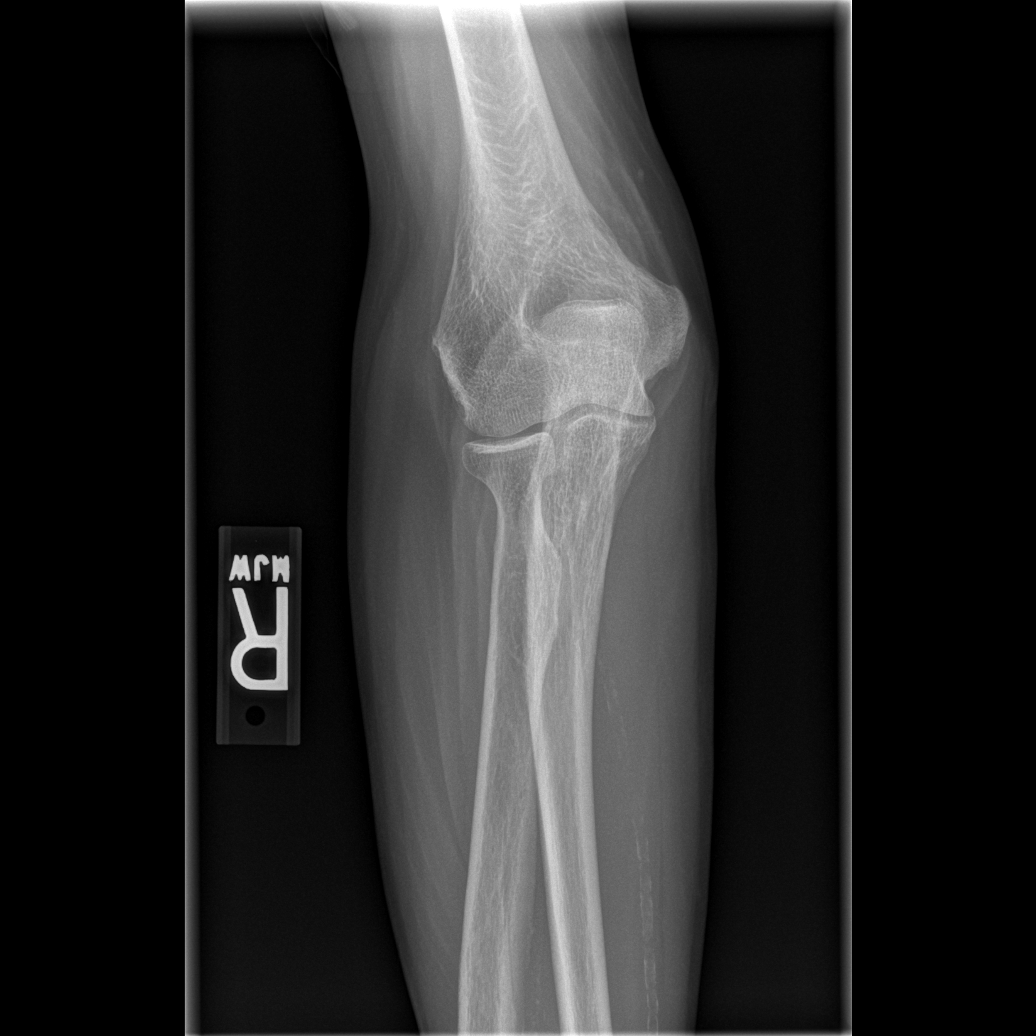

[x elbow joint obl. right (1 of 2)]
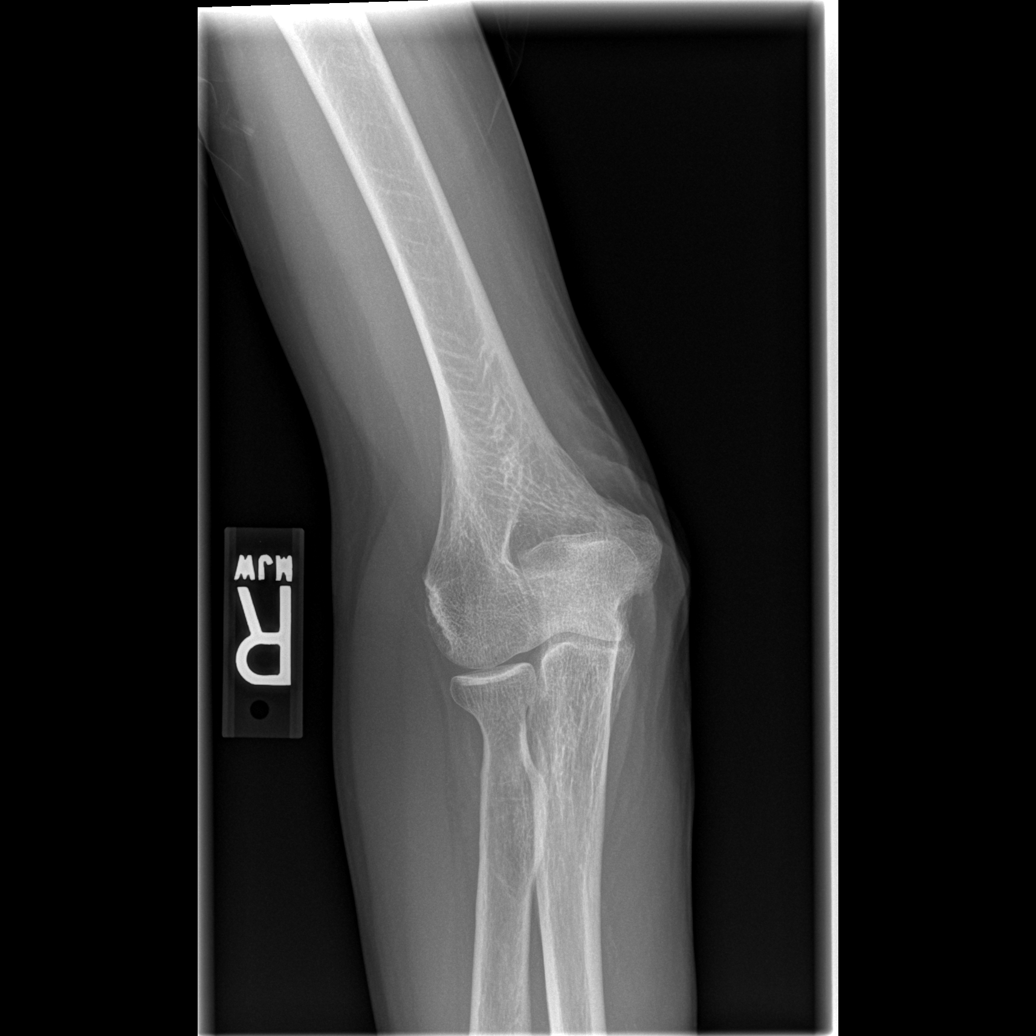

[x elbow joint obl. right (2 of 2)]
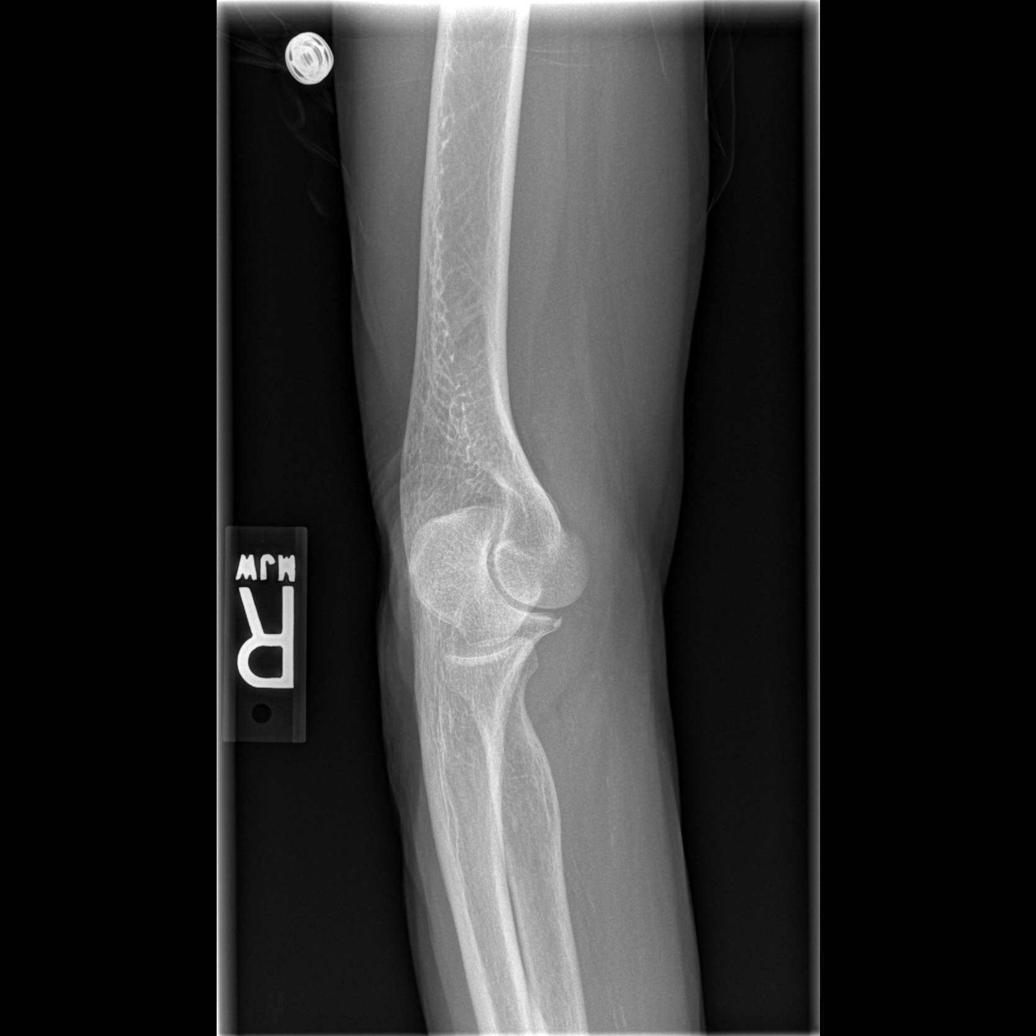

[x elbow joint lat right]
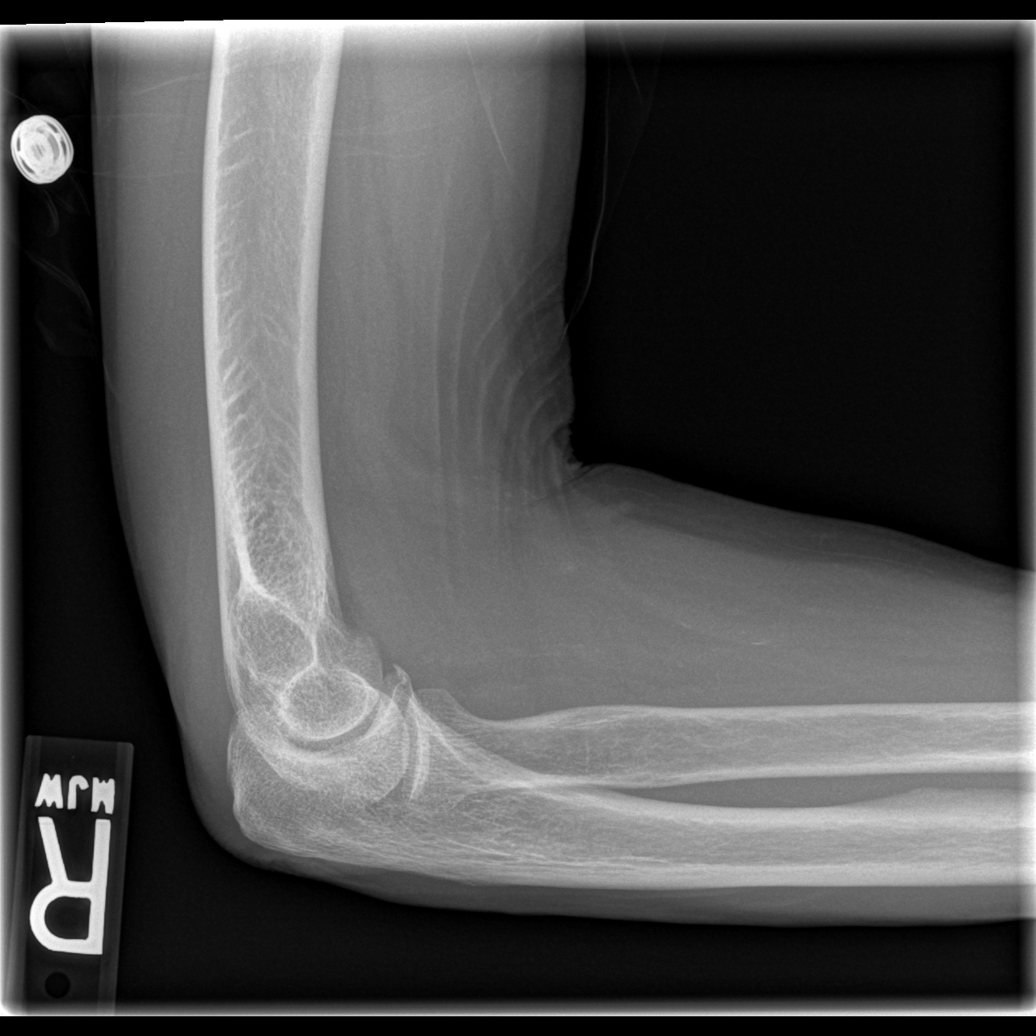

[4 of 4 positions shown; findings below may reference images not displayed]

FINDINGS: No acute bony abnormality.  Specifically, no fracture,
subluxation, or dislocation.  Soft tissues are intact.  Joint
spaces are maintained.  Minimal spurring.  No joint effusion.
IMPRESSION: No acute bony abnormality.
